# Patient Record
Sex: Male | Born: 1950 | Race: White | Hispanic: No | Smoking: Former smoker
Health system: Southern US, Community
[De-identification: ages and names within clinical notes are randomized; demographics above are authoritative.]

## PROBLEM LIST (undated history)

## (undated) DIAGNOSIS — C321 Malignant neoplasm of supraglottis: Secondary | ICD-10-CM

## (undated) DIAGNOSIS — F209 Schizophrenia, unspecified: Secondary | ICD-10-CM

## (undated) DIAGNOSIS — E785 Hyperlipidemia, unspecified: Secondary | ICD-10-CM

## (undated) DIAGNOSIS — E039 Hypothyroidism, unspecified: Secondary | ICD-10-CM

## (undated) DIAGNOSIS — K219 Gastro-esophageal reflux disease without esophagitis: Secondary | ICD-10-CM

## (undated) DIAGNOSIS — E119 Type 2 diabetes mellitus without complications: Secondary | ICD-10-CM

## (undated) DIAGNOSIS — F04 Amnestic disorder due to known physiological condition: Secondary | ICD-10-CM

## (undated) DIAGNOSIS — F1020 Alcohol dependence, uncomplicated: Secondary | ICD-10-CM

## (undated) HISTORY — PX: OTHER SURGICAL HISTORY: SHX169

---

## 2014-12-26 ENCOUNTER — Telehealth: Payer: Self-pay | Admitting: *Deleted

## 2014-12-26 NOTE — Telephone Encounter (Signed)
Office notes received from Dr. Edison Nasuti Sera at the [place] of CT scan

## 2014-12-29 ENCOUNTER — Telehealth: Payer: Self-pay | Admitting: *Deleted

## 2014-12-29 NOTE — Telephone Encounter (Signed)
  Oncology Nurse Navigator Documentation   Navigator Encounter Type: Introductory phone call (12/29/14 1556)     Attempted to speak with patient at [facility], no answer at nurses station. Will attempt again.  Gayleen Orem, RN, BSN, [place] at [place] [phone]                     Time Spent with Patient: 15 (12/29/14 1556)

## 2015-01-01 ENCOUNTER — Telehealth: Payer: Self-pay | Admitting: *Deleted

## 2015-01-01 NOTE — Telephone Encounter (Signed)
  Oncology Nurse Navigator Documentation   Navigator Encounter Type: Introductory phone call (patient and his sister, HCPOA) (01/01/15 1149)     Placed brief introductory call to new referral patient at [facility] and then at greater length to his sister who is HCPOA and will be bringing him to appt. 1. Introduced myself as the oncology nurse navigator that works with Dr. Isidore Moos to whom he has been referred by Dr. Edison Nasuti and with whom he has an appt this Friday at 0830/0900. 2. They confirmed understanding of referral and appt date/time. 3. I briefly explained my role as a navigator, indicated that I would be joining them during his appt. 4. I confirmed understanding of the [facility] location, explained arrival and RadOnc registration process for appt. 5. I provided my contact information, encouraged them to call with questions/concerns before appt. 6. She expressed appreciation for my call, understands I will call her again later this week before appt to answer any additional questions.  Gayleen Orem, RN, BSN, [place] at [place] [phone]                        Time Spent with Patient: 30 (01/01/15 1149)

## 2015-01-03 ENCOUNTER — Encounter: Payer: Self-pay | Admitting: *Deleted

## 2015-01-03 ENCOUNTER — Encounter: Payer: Self-pay | Admitting: Radiation Oncology

## 2015-01-03 NOTE — Progress Notes (Signed)
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 []  [x]  Pt denies  Swallowing concerns? [x]  []  Pt states due to pain, family reports he get choked while eating. SNF is giving him a soft diet because of choking  PEG? [x]  []  Placed in July, but not using at this time   Referrals Yes No Comments  Social Work? [x]  []    Dentistry? []  [x]    Swallowing therapy? []  [x]    Nutrition? []  [x]    Med/Onc? []  []  Unsure, sister reports he saw somebody in [place] at the [place], who did not recommend chemotherapy.   Safety Issues Yes No Comments  Prior radiation? []  [x]    Pacemaker/ICD? []  [x]    Possible current pregnancy? []  [x]    Is the patient on methotrexate? []  [x]     Tobacco/Marijuana/Snuff/ETOH use: tobacco: 1.5 pack per day for 47 years. ETOH: Heavy drinker, dementia, quit drinking around 4 years ago and lives in assisted living home in [place].  Past/Anticipated interventions by otolaryngology, if any: Direct Laryngoscopy with biopsy, Esophagoscopy 09/14/14  Past/Anticipated interventions by medical oncology, if any: None     Current Complaints / other details:  Saw Dr. Edison Nasuti ENT physician 08/23/14 in clinic. His voice has changed, hoarse, almost wet sounding at this time. His sister reports he may have received some xanax at [facility] because he is "some what out of it this morning". No chief complaint on file.
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
## 2015-01-04 ENCOUNTER — Telehealth: Payer: Self-pay | Admitting: *Deleted

## 2015-01-04 NOTE — Telephone Encounter (Addendum)
  Oncology Nurse Navigator Documentation   Navigator Encounter Type: Telephone (01/04/15 1600) Patient Visit Type: Follow-up (01/04/15 1600)     Spoke with patient sister/HCPOA to see if she had any questions prior her coming in tomorrow morning with her brother to see Dr. Isidore Moos.   I again reviewed arrival and registration procedures, she verbalized understanding. She indicated [facility] SNF will be bringing him to [facility]. I noted I will call [facility] to confirm understanding of arrival time and appt.  Called MG.  Transferred to Admission Office, [place] indicating [place] arrival for 0830 appt  Gayleen Orem, RN, BSN, [place] at [place] [phone]                    Time Spent with Patient: 15 (01/04/15 1600)

## 2015-01-05 ENCOUNTER — Ambulatory Visit
Admission: RE | Admit: 2015-01-05 | Discharge: 2015-01-05 | Disposition: A | Discharge status: Home / Self Care | Payer: Non-veteran care | Source: Ambulatory Visit | Attending: Radiation Oncology | Admitting: Radiation Oncology

## 2015-01-05 ENCOUNTER — Ambulatory Visit
Admission: RE | Admit: 2015-01-05 | Discharge: 2015-01-05 | Disposition: A | Discharge status: Home / Self Care | Payer: No Typology Code available for payment source | Source: Ambulatory Visit | Attending: Radiation Oncology | Admitting: Radiation Oncology

## 2015-01-05 ENCOUNTER — Telehealth: Payer: Self-pay | Admitting: *Deleted

## 2015-01-05 ENCOUNTER — Encounter: Payer: Self-pay | Admitting: *Deleted

## 2015-01-05 ENCOUNTER — Encounter: Payer: Self-pay | Admitting: Radiation Oncology

## 2015-01-05 VITALS — BP 106/61 | HR 89 | Temp 97.5°F | Wt 154.2 lb

## 2015-01-05 DIAGNOSIS — E119 Type 2 diabetes mellitus without complications: Secondary | ICD-10-CM | POA: Insufficient documentation

## 2015-01-05 DIAGNOSIS — F1010 Alcohol abuse, uncomplicated: Secondary | ICD-10-CM | POA: Insufficient documentation

## 2015-01-05 DIAGNOSIS — E039 Hypothyroidism, unspecified: Secondary | ICD-10-CM | POA: Diagnosis not present

## 2015-01-05 DIAGNOSIS — F209 Schizophrenia, unspecified: Secondary | ICD-10-CM | POA: Insufficient documentation

## 2015-01-05 DIAGNOSIS — Z7982 Long term (current) use of aspirin: Secondary | ICD-10-CM | POA: Insufficient documentation

## 2015-01-05 DIAGNOSIS — Z87891 Personal history of nicotine dependence: Secondary | ICD-10-CM | POA: Insufficient documentation

## 2015-01-05 DIAGNOSIS — Z79899 Other long term (current) drug therapy: Secondary | ICD-10-CM | POA: Diagnosis not present

## 2015-01-05 DIAGNOSIS — F0390 Unspecified dementia without behavioral disturbance: Secondary | ICD-10-CM | POA: Diagnosis not present

## 2015-01-05 DIAGNOSIS — Z510 Encounter for antineoplastic radiation therapy: Secondary | ICD-10-CM | POA: Diagnosis not present

## 2015-01-05 DIAGNOSIS — C321 Malignant neoplasm of supraglottis: Secondary | ICD-10-CM | POA: Diagnosis not present

## 2015-01-05 HISTORY — DX: Malignant neoplasm of supraglottis: C32.1

## 2015-01-05 HISTORY — DX: Type 2 diabetes mellitus without complications: E11.9

## 2015-01-05 HISTORY — DX: Schizophrenia, unspecified: F20.9

## 2015-01-05 HISTORY — DX: Gastro-esophageal reflux disease without esophagitis: K21.9

## 2015-01-05 HISTORY — DX: Hyperlipidemia, unspecified: E78.5

## 2015-01-05 HISTORY — DX: Amnestic disorder due to known physiological condition: F04

## 2015-01-05 HISTORY — DX: Alcohol dependence, uncomplicated: F10.20

## 2015-01-05 HISTORY — DX: Hypothyroidism, unspecified: E03.9

## 2015-01-05 LAB — CBC WITH DIFFERENTIAL/PLATELET
BASO%: 0.5 % (ref 0.0–2.0)
BASOS ABS: 0.1 10*3/uL (ref 0.0–0.1)
EOS ABS: 0.2 10*3/uL (ref 0.0–0.5)
EOS%: 1.7 % (ref 0.0–7.0)
HEMATOCRIT: 37.8 % — AB (ref 38.4–49.9)
HEMOGLOBIN: 12.6 g/dL — AB (ref 13.0–17.1)
LYMPH#: 2.3 10*3/uL (ref 0.9–3.3)
LYMPH%: 18.7 % (ref 14.0–49.0)
MCH: 30.2 pg (ref 27.2–33.4)
MCHC: 33.2 g/dL (ref 32.0–36.0)
MCV: 90.8 fL (ref 79.3–98.0)
MONO#: 1 10*3/uL — ABNORMAL HIGH (ref 0.1–0.9)
MONO%: 8.3 % (ref 0.0–14.0)
NEUT%: 70.8 % (ref 39.0–75.0)
NEUTROS ABS: 8.9 10*3/uL — AB (ref 1.5–6.5)
Platelets: 228 10*3/uL (ref 140–400)
RBC: 4.16 10*6/uL — ABNORMAL LOW (ref 4.20–5.82)
RDW: 14.6 % (ref 11.0–14.6)
WBC: 12.5 10*3/uL — AB (ref 4.0–10.3)

## 2015-01-05 LAB — TSH CHCC: TSH: 0.683 m(IU)/L (ref 0.320–4.118)

## 2015-01-05 LAB — COMPREHENSIVE METABOLIC PANEL (CC13)
ALBUMIN: 3.8 g/dL (ref 3.5–5.0)
ALK PHOS: 50 U/L (ref 40–150)
AST: 10 U/L (ref 5–34)
Anion Gap: 9 mEq/L (ref 3–11)
BILIRUBIN TOTAL: 0.96 mg/dL (ref 0.20–1.20)
BUN: 22.1 mg/dL (ref 7.0–26.0)
CO2: 26 mEq/L (ref 22–29)
CREATININE: 1.2 mg/dL (ref 0.7–1.3)
Calcium: 10 mg/dL (ref 8.4–10.4)
Chloride: 101 mEq/L (ref 98–109)
EGFR: 67 mL/min/{1.73_m2} — AB (ref >90)
GLUCOSE: 116 mg/dL (ref 70–140)
Potassium: 4.2 mEq/L (ref 3.5–5.1)
SODIUM: 136 meq/L (ref 136–145)
TOTAL PROTEIN: 7.2 g/dL (ref 6.4–8.3)

## 2015-01-05 MED ORDER — LARYNGOSCOPY SOLUTION RAD-ONC
15.0000 mL | Freq: Once | TOPICAL | Status: AC
  Administered 2015-01-05: 15 mL via TOPICAL
  Filled 2015-01-05: qty 15

## 2015-01-05 NOTE — Addendum Note (Signed)
Encounter addended by: Ernst Spell, RN on: 01/05/2015  4:42 PM<BR>     Documentation filed: Charges VN, Dx Association, Orders

## 2015-01-05 NOTE — Progress Notes (Signed)
  Oncology Nurse Navigator Documentation   Navigator Encounter Type: Initial RadOnc (01/05/15 0805)         Interventions: Education Method (01/05/15 0805)     Education Method: Verbal;Written (01/05/15 0805)      Met with patient during initial consult with Dr. Isidore Moos.  He was accompanied by his sister, Truman Hayward.    1. Patient currently residing at [facility]; [place]; cognitively challenged. 2. Further introduced myself as his Navigator, explained my role as a member of the Care Team.   3. Provided New Patient Information packet to sister (patient cognitively challenged), discussed contents:  Contact information for physician(s), myself, other members of the Care Team.  Advance Directive information ([place] blue pamphlet with LCSW contact info)  Fall Prevention Patient [place] information  Appointment Guideline  ACS Referral form  [place] campus map with highlight of [place] 4. Provided introductory explanation of radiation treatment including SIM planning and purpose of Aquaplast head and shoulder mask, showed them example.   5. Assisted Dr. Isidore Moos with laryngoscopy. 6. Provided a tour of SIM and [place] areas, explained treatment and arrival procedures. 7. I encouraged them to contact me with questions/concerns as treatments/procedures begin.  They verbalized understanding of information provided.      Time Spent with Patient: > 120 (01/05/15 0805)

## 2015-01-05 NOTE — Addendum Note (Signed)
Encounter addended by: Arbutus Ped, [place] on: 01/05/2015 11:18 AM<BR>     Documentation filed: Rx Order Verification

## 2015-01-05 NOTE — Addendum Note (Signed)
Encounter addended by: Ernst Spell, RN on: 01/05/2015 11:14 AM<BR>     Documentation filed: Inpatient MAR

## 2015-01-05 NOTE — Telephone Encounter (Signed)
CALLED PATIENT TO INFORM OF APPTS., NO ANSWER WILL CALL LATER.

## 2015-01-05 NOTE — Addendum Note (Signed)
Encounter addended by: Eppie Gibson, MD on: 01/05/2015 11:06 AM<BR>     Documentation filed: Follow-up Section

## 2015-01-05 NOTE — Addendum Note (Signed)
Encounter addended by: Ernst Spell, RN on: 01/05/2015  3:55 PM<BR>     Documentation filed: Charges VN

## 2015-01-05 NOTE — Addendum Note (Signed)
Encounter addended by: Ernst Spell, RN on: 01/05/2015 11:22 AM<BR>     Documentation filed: Dx Association, Inpatient MAR, Orders
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
## 2015-01-05 NOTE — Addendum Note (Signed)
Encounter addended by: Ernst Spell, RN on: 01/05/2015 11:09 AM<BR>     Documentation filed: Flowsheet VN, BPA Follow-up Actions

## 2015-01-08 ENCOUNTER — Telehealth: Payer: Self-pay | Admitting: *Deleted

## 2015-01-08 ENCOUNTER — Ambulatory Visit: Payer: Self-pay

## 2015-01-08 ENCOUNTER — Other Ambulatory Visit (HOSPITAL_COMMUNITY): Payer: Self-pay | Admitting: Radiation Oncology

## 2015-01-08 DIAGNOSIS — R1310 Dysphagia, unspecified: Secondary | ICD-10-CM

## 2015-01-08 NOTE — Telephone Encounter (Signed)
  Oncology Nurse Navigator Documentation   Navigator Encounter Type: Telephone (01/08/15 1003)         Interventions: Coordination of Care (01/08/15 1003)     Spoke with RN Curly [place] at [facility], provided update for upcoming appts:  Today's SLP cancelled.  10/18 1430 CT at [facility] Radiology with 1415 arrival, NPO 4 hrs prior  10/21 1300 MBS at [facility] Radiology with 1245 arrival, no restrictions  10/24 1400 PET at [facility] Radiology with 1345 arrival, NPO 6 hrs prior  10/24 1515 Nutrition at [facility] with 1500 arrival She verbalized understanding, indicated she wd relay info to his RN Ramona I provided my phone #, encouraged them to call me at any time with questions/concerns while he's being treated at [facility].  Gayleen Orem, RN, BSN, [place] at [place] [phone]            Time Spent with Patient: 15 (01/08/15 1003)

## 2015-01-09 ENCOUNTER — Encounter (HOSPITAL_COMMUNITY): Payer: Self-pay

## 2015-01-09 ENCOUNTER — Ambulatory Visit (HOSPITAL_COMMUNITY)
Admission: RE | Admit: 2015-01-09 | Discharge: 2015-01-09 | Disposition: A | Discharge status: Home / Self Care | Payer: Non-veteran care | Source: Ambulatory Visit | Attending: Radiation Oncology | Admitting: Radiation Oncology

## 2015-01-09 DIAGNOSIS — E119 Type 2 diabetes mellitus without complications: Secondary | ICD-10-CM | POA: Diagnosis not present

## 2015-01-09 DIAGNOSIS — C321 Malignant neoplasm of supraglottis: Secondary | ICD-10-CM | POA: Insufficient documentation

## 2015-01-09 DIAGNOSIS — M479 Spondylosis, unspecified: Secondary | ICD-10-CM | POA: Diagnosis not present

## 2015-01-09 DIAGNOSIS — F1021 Alcohol dependence, in remission: Secondary | ICD-10-CM | POA: Diagnosis not present

## 2015-01-09 DIAGNOSIS — F209 Schizophrenia, unspecified: Secondary | ICD-10-CM | POA: Diagnosis not present

## 2015-01-09 DIAGNOSIS — I6523 Occlusion and stenosis of bilateral carotid arteries: Secondary | ICD-10-CM | POA: Diagnosis not present

## 2015-01-09 DIAGNOSIS — R070 Pain in throat: Secondary | ICD-10-CM | POA: Insufficient documentation

## 2015-01-09 MED ORDER — IOHEXOL 300 MG/ML  SOLN
75.0000 mL | Freq: Once | INTRAMUSCULAR | Status: AC | PRN
  Administered 2015-01-09: 75 mL via INTRAVENOUS

## 2015-01-11 ENCOUNTER — Inpatient Hospital Stay
Admission: RE | Admit: 2015-01-11 | Discharge: 2015-01-11 | Disposition: A | Discharge status: Home / Self Care | Payer: Self-pay | Source: Ambulatory Visit | Attending: Radiation Oncology | Admitting: Radiation Oncology

## 2015-01-11 ENCOUNTER — Other Ambulatory Visit: Payer: Self-pay | Admitting: Radiation Oncology

## 2015-01-11 DIAGNOSIS — C321 Malignant neoplasm of supraglottis: Secondary | ICD-10-CM

## 2015-01-12 ENCOUNTER — Encounter: Payer: Self-pay | Admitting: *Deleted

## 2015-01-12 ENCOUNTER — Ambulatory Visit (HOSPITAL_COMMUNITY)
Admission: RE | Admit: 2015-01-12 | Discharge: 2015-01-12 | Disposition: A | Discharge status: Home / Self Care | Payer: Non-veteran care | Source: Ambulatory Visit | Attending: Radiation Oncology | Admitting: Radiation Oncology

## 2015-01-12 ENCOUNTER — Telehealth: Payer: Self-pay | Admitting: *Deleted

## 2015-01-12 DIAGNOSIS — R1310 Dysphagia, unspecified: Secondary | ICD-10-CM | POA: Insufficient documentation

## 2015-01-12 DIAGNOSIS — C321 Malignant neoplasm of supraglottis: Secondary | ICD-10-CM

## 2015-01-12 NOTE — Telephone Encounter (Signed)
  Oncology Nurse Navigator Documentation   Navigator Encounter Type: Telephone (01/12/15 1121)         Interventions: Coordination of Care (01/12/15 1121)     Spoke with Nance Pear, RN, at [facility], informed her of Monday appt at [facility] with arrival of 1245.  Provided phone # for [place] [phone], [phone]) to call with questions re patient's PEG.  Gayleen Orem, RN, BSN, [place] at [place] [phone]             Time Spent with Patient: 15 (01/12/15 1121)

## 2015-01-12 NOTE — Progress Notes (Addendum)
Oncology Nurse Navigator Documentation   Navigator Encounter Type: Other (01/12/15 1230)     Met patient and his sister, Nathaniel Floyd, upon arrival at [facility], escorted to [facility] Radiology for his MBSS. Discussed next Monday's appts with Nathaniel Floyd, provided her Epic appt calendar, encouraged 12:45 arrival to Radiation Oncology Waiting.   She stated she would be bringing him to appts leading up to and including initial RTs after which [facility] will bring him. She provided copy of ADs which I later delivered to [place] for scanning.  Gayleen Orem, RN, BSN, [place] at [place] [phone]                     Time Spent with Patient: 30 (01/12/15 1230)                        Time Spent with Patient: 30 (01/12/15 1230)

## 2015-01-13 ENCOUNTER — Encounter (HOSPITAL_COMMUNITY): Payer: Non-veteran care

## 2015-01-13 ENCOUNTER — Encounter (HOSPITAL_COMMUNITY): Payer: Self-pay

## 2015-01-13 ENCOUNTER — Emergency Department (HOSPITAL_COMMUNITY): Payer: Non-veteran care

## 2015-01-13 ENCOUNTER — Emergency Department (HOSPITAL_COMMUNITY)
Admission: EM | Admit: 2015-01-13 | Discharge: 2015-01-13 | Disposition: A | Discharge status: Home / Self Care | Payer: Non-veteran care | Attending: Emergency Medicine | Admitting: Emergency Medicine

## 2015-01-13 DIAGNOSIS — Z434 Encounter for attention to other artificial openings of digestive tract: Secondary | ICD-10-CM

## 2015-01-13 DIAGNOSIS — T85528A Displacement of other gastrointestinal prosthetic devices, implants and grafts, initial encounter: Secondary | ICD-10-CM

## 2015-01-13 DIAGNOSIS — Z79899 Other long term (current) drug therapy: Secondary | ICD-10-CM | POA: Insufficient documentation

## 2015-01-13 DIAGNOSIS — E039 Hypothyroidism, unspecified: Secondary | ICD-10-CM | POA: Diagnosis not present

## 2015-01-13 DIAGNOSIS — E119 Type 2 diabetes mellitus without complications: Secondary | ICD-10-CM | POA: Insufficient documentation

## 2015-01-13 DIAGNOSIS — Z87891 Personal history of nicotine dependence: Secondary | ICD-10-CM | POA: Diagnosis not present

## 2015-01-13 DIAGNOSIS — Z85828 Personal history of other malignant neoplasm of skin: Secondary | ICD-10-CM | POA: Diagnosis not present

## 2015-01-13 DIAGNOSIS — K9429 Other complications of gastrostomy: Secondary | ICD-10-CM | POA: Diagnosis present

## 2015-01-13 DIAGNOSIS — Z7951 Long term (current) use of inhaled steroids: Secondary | ICD-10-CM | POA: Insufficient documentation

## 2015-01-13 DIAGNOSIS — E785 Hyperlipidemia, unspecified: Secondary | ICD-10-CM | POA: Diagnosis not present

## 2015-01-13 DIAGNOSIS — F209 Schizophrenia, unspecified: Secondary | ICD-10-CM | POA: Insufficient documentation

## 2015-01-13 DIAGNOSIS — K219 Gastro-esophageal reflux disease without esophagitis: Secondary | ICD-10-CM | POA: Diagnosis not present

## 2015-01-13 DIAGNOSIS — Z7982 Long term (current) use of aspirin: Secondary | ICD-10-CM | POA: Insufficient documentation

## 2015-01-13 LAB — CBC WITH DIFFERENTIAL/PLATELET
BASOS ABS: 0 10*3/uL (ref 0.0–0.1)
BASOS PCT: 0 %
EOS ABS: 0.1 10*3/uL (ref 0.0–0.7)
EOS PCT: 1 %
HCT: 36 % — ABNORMAL LOW (ref 39.0–52.0)
Hemoglobin: 11.7 g/dL — ABNORMAL LOW (ref 13.0–17.0)
Lymphocytes Relative: 14 %
Lymphs Abs: 1.7 10*3/uL (ref 0.7–4.0)
MCH: 29.9 pg (ref 26.0–34.0)
MCHC: 32.5 g/dL (ref 30.0–36.0)
MCV: 92.1 fL (ref 78.0–100.0)
MONO ABS: 0.8 10*3/uL (ref 0.1–1.0)
Monocytes Relative: 7 %
Neutro Abs: 9.4 10*3/uL — ABNORMAL HIGH (ref 1.7–7.7)
Neutrophils Relative %: 78 %
PLATELETS: 225 10*3/uL (ref 150–400)
RBC: 3.91 MIL/uL — AB (ref 4.22–5.81)
RDW: 13.8 % (ref 11.5–15.5)
WBC: 12 10*3/uL — AB (ref 4.0–10.5)

## 2015-01-13 LAB — BASIC METABOLIC PANEL
ANION GAP: 9 (ref 5–15)
BUN: 15 mg/dL (ref 6–20)
CALCIUM: 9.6 mg/dL (ref 8.9–10.3)
CO2: 27 mmol/L (ref 22–32)
Chloride: 96 mmol/L — ABNORMAL LOW (ref 101–111)
Creatinine, Ser: 0.82 mg/dL (ref 0.61–1.24)
GFR calc non Af Amer: >60 mL/min (ref >60)
GLUCOSE: 114 mg/dL — AB (ref 65–99)
Potassium: 4.8 mmol/L (ref 3.5–5.1)
SODIUM: 132 mmol/L — AB (ref 135–145)

## 2015-01-13 LAB — PROTIME-INR
INR: 1.08 (ref 0.00–1.49)
Prothrombin Time: 14.2 seconds (ref 11.6–15.2)

## 2015-01-13 MED ORDER — SODIUM CHLORIDE 0.9 % IV BOLUS (SEPSIS): 1000.0000 mL | Freq: Once | INTRAVENOUS | Status: DC

## 2015-01-13 NOTE — Discharge Instructions (Signed)
PLEASE DO NOT USE THE TUBE. PLEASE HAVE Nathaniel Floyd COME IN TO [place] (NOT THE ER), ASK THE RECEPTIONIST FOR OUTPATIENT RADIOLOGY DEPARTMENT - AND WAIT FOR THE DOCTOR TO HELP YOU THERE. MAKE SURE HE ARRIVES BY 8:30 AM. KEEP PATIENT NPO AFTER MIDNIGHT.  DR. Shick is the interventional radiologist who will take care of the procedure.

## 2015-01-13 NOTE — ED Notes (Signed)
Pt stable, ambulatory, states understanding of discharge instructions reported instructions to [facility]
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
## 2015-01-13 NOTE — ED Notes (Signed)
PTAR called 6:44

## 2015-01-13 NOTE — ED Notes (Signed)
Dr. Nanavati at bedside 

## 2015-01-13 NOTE — ED Notes (Addendum)
Per EMS - from [facility]. Pt gtube dislocated - unsure if dislocated today or yesterday; placed October 18, 2014. NPO at this time. Supposed to have procedure at [place] at bedside. Pt has squamous cell carcinoma of epiglottis.

## 2015-01-13 NOTE — ED Notes (Signed)
[place] spoke with Leilani Able who stated the G-tube was placed on July 25th at [facility] and it was unknown how long it has been out that she had never worked with it before.

## 2015-01-14 ENCOUNTER — Other Ambulatory Visit (HOSPITAL_COMMUNITY): Payer: Self-pay | Admitting: Emergency Medicine

## 2015-01-14 ENCOUNTER — Ambulatory Visit (HOSPITAL_COMMUNITY)
Admission: RE | Admit: 2015-01-14 | Discharge: 2015-01-14 | Disposition: A | Discharge status: Home / Self Care | Payer: Non-veteran care | Source: Ambulatory Visit | Attending: Emergency Medicine | Admitting: Emergency Medicine

## 2015-01-14 DIAGNOSIS — Z431 Encounter for attention to gastrostomy: Secondary | ICD-10-CM | POA: Diagnosis not present

## 2015-01-14 DIAGNOSIS — T85528A Displacement of other gastrointestinal prosthetic devices, implants and grafts, initial encounter: Secondary | ICD-10-CM

## 2015-01-14 MED ORDER — IOHEXOL 300 MG/ML  SOLN
50.0000 mL | Freq: Once | INTRAMUSCULAR | Status: DC | PRN
  Administered 2015-01-14: 10 mL
  Filled 2015-01-14: qty 50

## 2015-01-14 MED ORDER — LIDOCAINE VISCOUS 2 % MT SOLN
OROMUCOSAL | Status: AC
  Filled 2015-01-14: qty 15

## 2015-01-14 NOTE — Procedures (Signed)
Successful replacement of a 20 fr Gtube  No comp Ready for use

## 2015-01-15 ENCOUNTER — Ambulatory Visit
Admission: RE | Admit: 2015-01-15 | Discharge: 2015-01-15 | Disposition: A | Discharge status: Home / Self Care | Payer: No Typology Code available for payment source | Source: Ambulatory Visit | Attending: Radiation Oncology | Admitting: Radiation Oncology

## 2015-01-15 ENCOUNTER — Encounter: Payer: Self-pay | Admitting: *Deleted

## 2015-01-15 ENCOUNTER — Ambulatory Visit: Payer: Self-pay | Admitting: Nutrition

## 2015-01-15 ENCOUNTER — Encounter (HOSPITAL_COMMUNITY)
Admission: RE | Admit: 2015-01-15 | Discharge: 2015-01-15 | Disposition: A | Discharge status: Home / Self Care | Payer: Non-veteran care | Source: Ambulatory Visit | Attending: Radiation Oncology | Admitting: Radiation Oncology

## 2015-01-15 DIAGNOSIS — C321 Malignant neoplasm of supraglottis: Secondary | ICD-10-CM | POA: Diagnosis present

## 2015-01-15 DIAGNOSIS — Z510 Encounter for antineoplastic radiation therapy: Secondary | ICD-10-CM | POA: Diagnosis not present

## 2015-01-15 LAB — GLUCOSE, CAPILLARY: GLUCOSE-CAPILLARY: 95 mg/dL (ref 65–99)

## 2015-01-15 MED ORDER — FLUDEOXYGLUCOSE F - 18 (FDG) INJECTION
9.0000 | Freq: Once | INTRAVENOUS | Status: DC | PRN
  Administered 2015-01-15: 9 via INTRAVENOUS
  Filled 2015-01-15: qty 9

## 2015-01-15 NOTE — Progress Notes (Signed)
  Oncology Nurse Navigator Documentation   Navigator Encounter Type: Other (01/15/15 1245) Patient Visit Type: Radonc (01/15/15 1245) Treatment Phase: CT SIM;Other (01/15/15 1245)     To provide support and encouragement, care continuity and to assess for needs, met with patient for [facility] after which escorted him to [facility] for PET. His sister Truman Hayward accompanied him. He tolerated CT SIM without difficulty.  I reviewed date of RT start, expected education by RN during first week of RT. Sister brought [place] AD forms for completion.  I encouraged her to contact Lauren when they arrive for Nutrition appt following PET to complete. They understand I can be contacted with needs/concerns.  Gayleen Orem, RN, BSN, [place] at [place] [phone]                   Time Spent with Patient: 90 (01/15/15 1245)

## 2015-01-15 NOTE — Progress Notes (Signed)
Head and Neck Cancer Simulation, IMRT treatment planning note   Outpatient  Diagnosis:    ICD-9-CM ICD-10-CM   1. Cancer, epiglottis (HCC) 161.1 C32.1     The patient was taken to the CT simulator and laid in the supine position on the table. An Aquaplast head and shoulder mask was custom fitted to the patient's anatomy. High-resolution CT axial imaging was obtained of the head and neck with contrast. I verified that the quality of the imaging is good for treatment planning. 1 Medically Necessary Treatment Device was fabricated and supervised by me: Aquaplast mask.   Treatment planning note I plan to treat the patient with IMRT. I plan to treat the patient's right soft palate and epiglottic tumors and bilateral neck nodes. I plan to treat to a total dose of 70 Gray in 35  fractions. Dose calculation was ordered from dosimetry.  IMRT planning Note  IMRT is an important modality to deliver adequate dose to the patient's at risk tissues while sparing the patient's normal structures, including the: esophagus, parotid tissue, mandible, brain stem, spinal cord, oral cavity, brachial plexus.  This justifies the use of IMRT in the patient's treatment.     -----------------------------------  Eppie Gibson, MD

## 2015-01-15 NOTE — Progress Notes (Addendum)
64 year old male diagnosed with cancer of the epiglottis.  He is to receive radiation therapy.  Past medical history includes GERD, hypothyroidism, hyperlipidemia, schizophrenia, Korsakoff syndrome, alcoholism and diabetes.  Medications include vitamin D, Celexa, Colace, Haldol, Synthroid, Glucophage, Seroquel, Zantac, Zocor.  Labs were reviewed.  Height: 5 feet 9 inches. Weight: 154.2 pounds on October 14.  Estimated nutrition needs: 2100-2450 calories, 100-120 grams protein, 2.3 L fluid.  Patient noted to have dysphasia and is status post feeding tube placement. Patient is status post CT scan. Patient lives at a skilled nursing facility. Met with patient's sister, Truman Hayward, who reports patient has now started tube feedings and is tolerating 6 cans Nutren 1.5 daily providing 2250 cal, 102 g protein, and 1146 mL free water. She reports patient is no longer eating by mouth. She is not aware of any nutrition issues with tube feedings.  Nutrition diagnosis: Food and nutrition related knowledge deficit related to epiglottis cancer as evidenced by no prior need for nutrition related information.  Intervention:  Educated patient's sister that tube feeding appears adequate to meet patient's estimated nutrition needs. Recommended a minimum of 5-8 ounce glasses of water daily, via feeding tube as flushes before and after feedings. Explained that our goal would be weight maintenance throughout treatment. Provided my contact information for patient's sister to provide to nursing home if needed. [facility]. Questions were answered.  Teach back method used.  Monitoring, evaluation, goals: Patient will tolerate tube feedings to meet greater than 90% of estimated needs for weight maintenance.  Next visit: Patient's sister has my contact information for questions.  **Disclaimer: This note was dictated with voice recognition software. Similar sounding words can  inadvertently be transcribed and this note may contain transcription errors which may not have been corrected upon publication of note.**

## 2015-01-17 ENCOUNTER — Encounter: Payer: Self-pay | Admitting: *Deleted

## 2015-01-17 ENCOUNTER — Telehealth: Payer: Self-pay | Admitting: *Deleted

## 2015-01-17 NOTE — Telephone Encounter (Signed)
  Oncology Nurse Navigator Documentation   Navigator Encounter Type: Other (01/17/15 2094)     Per Dr. Pearlie Oyster guidance, called pt sister informed results of 10/24 showed absence of metastatic disease, RT to proceed as originally planned. I confirmed her understanding of 11/2 020 [place].    She indicated she will meet her brother at [facility], requested that [facility] staff start bringing him for RT appts.  I indicated I will provide MG appt schedule.   Gayleen Orem, RN, BSN, [place] at [place] [phone]                      Time Spent with Patient: 15 (01/17/15 0906)

## 2015-01-17 NOTE — Progress Notes (Signed)
  Oncology Nurse Navigator Documentation   Navigator Encounter Type: Written/Letter (01/17/15 0941)         Interventions: Coordination of Care (01/17/15 0941)     Sent fax to patient's [facility] RN, Fuller Song [phone]) with November calendar of appts for RT starting next week.  Transmission notice successful. Included note indicating per patient's sister, MG should begin providing transport for appts; I noted arrival 20 minutes prior to appt time. I requested RN call upon receipt/review.  Gayleen Orem, RN, BSN, [place] at [place] [phone]            Time Spent with Patient: 15 (01/17/15 0941)

## 2015-01-19 DIAGNOSIS — Z510 Encounter for antineoplastic radiation therapy: Secondary | ICD-10-CM | POA: Diagnosis not present

## 2015-01-23 ENCOUNTER — Ambulatory Visit: Payer: No Typology Code available for payment source

## 2015-01-23 NOTE — Progress Notes (Signed)
IMRT Device Note Outpatient    ICD-9-CM ICD-10-CM   1. Cancer, epiglottis (HCC) 161.1 C32.1     9.6 delivered field widths represent one set of IMRT treatment devices. The code is 8203300789.  -----------------------------------  Eppie Gibson, MD

## 2015-01-24 ENCOUNTER — Telehealth: Payer: Self-pay

## 2015-01-24 ENCOUNTER — Ambulatory Visit
Admission: RE | Admit: 2015-01-24 | Discharge: 2015-01-24 | Disposition: A | Discharge status: Home / Self Care | Payer: No Typology Code available for payment source | Source: Ambulatory Visit | Attending: Radiation Oncology | Admitting: Radiation Oncology

## 2015-01-24 DIAGNOSIS — C321 Malignant neoplasm of supraglottis: Secondary | ICD-10-CM

## 2015-01-24 DIAGNOSIS — Z510 Encounter for antineoplastic radiation therapy: Secondary | ICD-10-CM | POA: Diagnosis not present

## 2015-01-24 NOTE — Telephone Encounter (Signed)
Nathaniel Floyd became agitated during his radiation treatment today, and the technician Lennie Muckle) asked I call [place] [facility] to request he be given a calming medicine before his radiation treatments each day. I spoke with Ramona his nurse, and she stated she had given him some Haldol before he left today. She stated she may try some pain medicine tomorrow, and see if that helps. I will inform Lennie Muckle of this conversation.

## 2015-01-25 ENCOUNTER — Ambulatory Visit
Admission: RE | Admit: 2015-01-25 | Discharge: 2015-01-25 | Disposition: A | Discharge status: Home / Self Care | Payer: No Typology Code available for payment source | Source: Ambulatory Visit | Attending: Radiation Oncology | Admitting: Radiation Oncology

## 2015-01-25 ENCOUNTER — Telehealth: Payer: Self-pay

## 2015-01-25 ENCOUNTER — Encounter: Payer: Self-pay | Admitting: *Deleted

## 2015-01-25 DIAGNOSIS — Z510 Encounter for antineoplastic radiation therapy: Secondary | ICD-10-CM | POA: Diagnosis not present

## 2015-01-25 NOTE — Progress Notes (Signed)
  Oncology Nurse Navigator Documentation   Navigator Encounter Type: Treatment (01/25/15 0935) Patient Visit Type: Radonc (01/25/15 0935)     To provide support and encouragement, care continuity and to assess for needs, met with patient after [place] tmt.  He was accompanied by his [employer] transporter. He completed tmt but according to therapists, he had difficulty lying still, had to be scanned x2 before he could be treated.  [name], lead therapist, indicated RN Anderson Malta is going to call [facility] to check on pre-treatment medication. I spoke with patient, he did not seem to remember me from previous encounters. I will increase presence at future tmts.  Gayleen Orem, RN, BSN, [place] at [place] [phone]                     Time Spent with Patient: 15 (01/25/15 0935)

## 2015-01-25 NOTE — Telephone Encounter (Signed)
I called [place] home and spoke with Nathaniel Floyd (Nathaniel Floyd's nurse). She stated she had given him 10mg  of Haldol and 20 ml of Norco this morning before he left for Radiation treatment. I mentioned to her that he was constantly moving his legs and arms while he was being treated today. She stated she would speak with Dr. Deforest Hoyles at [facility] about prescribing ativan possibly to assist with his future radiation treatments. I will inform [name] of this phone call.

## 2015-01-26 ENCOUNTER — Ambulatory Visit
Admission: RE | Admit: 2015-01-26 | Discharge: 2015-01-26 | Disposition: A | Discharge status: Home / Self Care | Payer: No Typology Code available for payment source | Source: Ambulatory Visit | Attending: Radiation Oncology | Admitting: Radiation Oncology

## 2015-01-26 DIAGNOSIS — Z510 Encounter for antineoplastic radiation therapy: Secondary | ICD-10-CM | POA: Diagnosis not present

## 2015-01-29 ENCOUNTER — Ambulatory Visit
Admission: RE | Admit: 2015-01-29 | Discharge: 2015-01-29 | Disposition: A | Discharge status: Home / Self Care | Payer: No Typology Code available for payment source | Source: Ambulatory Visit | Attending: Radiation Oncology | Admitting: Radiation Oncology

## 2015-01-29 ENCOUNTER — Encounter: Payer: Self-pay | Admitting: Radiation Oncology

## 2015-01-29 ENCOUNTER — Ambulatory Visit
Admission: RE | Admit: 2015-01-29 | Discharge: 2015-01-29 | Disposition: A | Discharge status: Home / Self Care | Payer: Non-veteran care | Source: Ambulatory Visit | Attending: Radiation Oncology | Admitting: Radiation Oncology

## 2015-01-29 ENCOUNTER — Other Ambulatory Visit (HOSPITAL_COMMUNITY): Payer: Self-pay | Admitting: Radiation Oncology

## 2015-01-29 ENCOUNTER — Encounter: Payer: Self-pay | Admitting: *Deleted

## 2015-01-29 VITALS — BP 103/61 | HR 74 | Temp 97.4°F | Ht 69.0 in | Wt 151.7 lb

## 2015-01-29 DIAGNOSIS — R1310 Dysphagia, unspecified: Secondary | ICD-10-CM

## 2015-01-29 DIAGNOSIS — C321 Malignant neoplasm of supraglottis: Secondary | ICD-10-CM | POA: Insufficient documentation

## 2015-01-29 DIAGNOSIS — Z510 Encounter for antineoplastic radiation therapy: Secondary | ICD-10-CM | POA: Diagnosis not present

## 2015-01-29 MED ORDER — SONAFINE EX EMUL
1.0000 "application " | Freq: Once | CUTANEOUS | Status: AC
  Administered 2015-01-29: 1 via TOPICAL
  Filled 2015-01-29: qty 45

## 2015-01-29 NOTE — Progress Notes (Signed)
Mr. Aundrey Elahi is here for his 4th fraction of radiaton to his Right soft palate and epiglottis. He denies any pain at this time. He is being fed by [facility] through his feeding tube. The orders are Nutren 1.5 can bolus three times a day 9am- 12pm with a water flush 80 ml before and after bolus. Per the notes he also receives a continuous feeding 1pm-1am 99ml X 12, and a water flush of 1200 ml three times a day. His skin is normal appearing, and I will provide them with biafine cream today. He was able to stay still during radiation today.  BP 103/61 mmHg  Pulse 74  Temp(Src) 97.4 F (36.3 C)  Ht 5\' 9"  (1.753 m)  Wt 151 lb 11.2 oz (68.811 kg)  BMI 22.39 kg/m2   Wt Readings from Last 3 Encounters:  01/29/15 151 lb 11.2 oz (68.811 kg)  01/05/15 154 lb 3.2 oz (69.945 kg)

## 2015-01-29 NOTE — Progress Notes (Signed)
   Weekly Management Note:  Outpatient    ICD-9-CM ICD-10-CM   1. 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  CBCT/MVCT images/Port film x-rays were reviewed.  The chart was checked. No  New complaints.  Patient is a very limited historian, with sister today.  Still in [place]. Denies significant pain.  Physical Findings:  Wt Readings from Last 3 Encounters:  01/29/15 151 lb 11.2 oz (68.811 kg)  01/05/15 154 lb 3.2 oz (69.945 kg)    height is 5\' 9"  (1.753 m) and weight is 151 lb 11.2 oz (68.811 kg). His temperature is 97.4 F (36.3 C). His blood pressure is 103/61 and his pulse is 74.  Right soft palate lesion slightly larger, skin without irritation. Moist mouth  CBC    Component Value Date/Time   WBC 12.0* 01/13/2015 1550   WBC 12.5* 01/05/2015 1031   RBC 3.91* 01/13/2015 1550   RBC 4.16* 01/05/2015 1031   HGB 11.7* 01/13/2015 1550   HGB 12.6* 01/05/2015 1031   HCT 36.0* 01/13/2015 1550   HCT 37.8* 01/05/2015 1031   PLT 225 01/13/2015 1550   PLT 228 01/05/2015 1031   MCV 92.1 01/13/2015 1550   MCV 90.8 01/05/2015 1031   MCH 29.9 01/13/2015 1550   MCH 30.2 01/05/2015 1031   MCHC 32.5 01/13/2015 1550   MCHC 33.2 01/05/2015 1031   RDW 13.8 01/13/2015 1550   RDW 14.6 01/05/2015 1031   LYMPHSABS 1.7 01/13/2015 1550   LYMPHSABS 2.3 01/05/2015 1031   MONOABS 0.8 01/13/2015 1550   MONOABS 1.0* 01/05/2015 1031   EOSABS 0.1 01/13/2015 1550   EOSABS 0.2 01/05/2015 1031   BASOSABS 0.0 01/13/2015 1550   BASOSABS 0.1 01/05/2015 1031     CMP     Component Value Date/Time   NA 132* 01/13/2015 1550   NA 136 01/05/2015 1031   K 4.8 01/13/2015 1550   K 4.2 01/05/2015 1031   CL 96* 01/13/2015 1550   CO2 27 01/13/2015 1550   CO2 26 01/05/2015 1031   GLUCOSE 114* 01/13/2015 1550   GLUCOSE 116 01/05/2015 1031   BUN 15 01/13/2015 1550   BUN 22.1 01/05/2015 1031   CREATININE 0.82 01/13/2015 1550   CREATININE 1.2 01/05/2015 1031   CALCIUM 9.6 01/13/2015 1550   CALCIUM 10.0 01/05/2015 1031   PROT 7.2 01/05/2015 1031   ALBUMIN 3.8 01/05/2015 1031   AST 10 01/05/2015 1031   ALT <9 01/05/2015 1031   ALKPHOS 50 01/05/2015 1031   BILITOT 0.96 01/05/2015 1031   GFRNONAA >60 01/13/2015 1550   GFRAA >60 01/13/2015 1550     Impression:  The patient is tolerating radiotherapy.   Plan:  Continue radiotherapy as planned. Wrote note to SNF about offering Hycet prn pain in future, as on his med list.  Recommended followup with SNF's SLP due to abnormal MBSS.  Pt cannot see Garald Balding due to insurance issues.  -----------------------------------  Eppie Gibson, MD

## 2015-01-29 NOTE — Progress Notes (Signed)
Pt here for patient teaching.  Pt given Radiation and You booklet, Managing Acute Radiation Side Effects for Head and Neck Cancer handout, skin care instructions and Biafine. Pt reports they have refused to watch the Radiation Therapy Education video.  Reviewed areas of pertinence such as fatigue, mouth changes, skin changes, throat changes, breast swelling, cough, shortness of breath, earaches and taste changes . Pt able to give teach back of to pat skin, use unscented/gentle soap and use baby wipes,apply Biafine bid, avoid applying anything to skin within 4 hours of treatment and to use an electric razor if they must shave. Pt needs reinforcement of information given and will contact nursing with any questions or concerns.  I will contact [facility] nurse, Ramona with instruction regarding skin care.

## 2015-01-30 ENCOUNTER — Ambulatory Visit
Admission: RE | Admit: 2015-01-30 | Discharge: 2015-01-30 | Disposition: A | Discharge status: Home / Self Care | Payer: No Typology Code available for payment source | Source: Ambulatory Visit | Attending: Radiation Oncology | Admitting: Radiation Oncology

## 2015-01-30 DIAGNOSIS — Z510 Encounter for antineoplastic radiation therapy: Secondary | ICD-10-CM | POA: Diagnosis not present

## 2015-01-31 ENCOUNTER — Telehealth: Payer: Self-pay

## 2015-01-31 ENCOUNTER — Ambulatory Visit: Admission: RE | Admit: 2015-01-31 | Payer: No Typology Code available for payment source | Source: Ambulatory Visit

## 2015-01-31 NOTE — Progress Notes (Signed)
  Oncology Nurse Navigator Documentation   Navigator Encounter Type: Treatment (01/29/15 1000) Patient Visit Type: Radonc (01/29/15 1000)     Met with patient during Tomo tmt.  He was accompanied by his sister, Nathaniel Floyd. She noted she will not accompany him every day though will bring him for tmt on Monday's so she can sit in on WUT with Nathaniel Floyd. He tolerated tmt without difficulty. I escorted them to WUT appt with Nathaniel Floyd. Will continue to follow.  Gayleen Orem, RN, BSN, [place] at [place] [phone]                  Time Spent with Patient: 30 (01/29/15 1000)

## 2015-01-31 NOTE — Telephone Encounter (Signed)
I called and spoke with Ramona at [facility] about Mr. Cord Wilczynski. I informed her that he was not able to receive treatment today because of his agitation. She told me she had given him Haldol and Hycet approximately one hour before he was transported to the [facility]. She even stated he was asleep when his transportation came to get him this morning. I encouraged her to continue to give him the medicine thirty minutes to one hour before transport picked him up to assist in completing his radiation treatments without agitation. She will again speak with the MD at [facility] about any other alternatives to assist with completing his radiation. I also discussed the education booklet and cream I had given him to bring back to [facility] on Monday 11/7. She had received it and verbalized her understanding regarding use of the cream, and the information presented in the booklet.

## 2015-02-01 ENCOUNTER — Ambulatory Visit: Payer: No Typology Code available for payment source

## 2015-02-01 ENCOUNTER — Encounter: Payer: Self-pay | Admitting: *Deleted

## 2015-02-01 DIAGNOSIS — Z510 Encounter for antineoplastic radiation therapy: Secondary | ICD-10-CM | POA: Diagnosis not present

## 2015-02-01 NOTE — Progress Notes (Signed)
LATE NOTE:  [place] Psychosocial Distress Screening Clinical Social Work  Clinical Social Work was referred by distress screening protocol.  The patient scored a 10 on the Psychosocial Distress Thermometer which indicates severe distress. Clinical Social Worker met with patient and patient's sister, Nathaniel Floyd, prior to starting treatment to assess for distress and other psychosocial needs. [name] shared no concerns, reported he was hesitant to do treatment.  Patient's sister spoke on patient's behalf.  Patient plans to stay at [facility] through treatment process, with long term goal to be to move close to sister after treatment.  CSW encouraged patient and family to contact CSW as needed for support, resources, or questions/concerns.  ONCBCN DISTRESS SCREENING 01/05/2015  Screening Type Initial Screening  Distress experienced in past week (1-10) 10  Practical problem type Housing  Emotional problem type Depression;Feeling hopeless  Physical Problem type Pain;Talking  Physician notified of physical symptoms Yes  Referral to clinical psychology No  Referral to clinical social work Yes  Referral to dietition No  Referral to financial advocate No  Referral to support programs No  Referral to palliative care No  Other (No Data)    Clinical Social Worker follow up needed: No.  If yes, follow up plan:  Polo Riley, MSW, LCSW, OSW-C Clinical Social Worker [place] [phone]

## 2015-02-02 ENCOUNTER — Ambulatory Visit: Admission: RE | Admit: 2015-02-02 | Payer: No Typology Code available for payment source | Source: Ambulatory Visit

## 2015-02-02 ENCOUNTER — Encounter: Payer: Self-pay | Admitting: *Deleted

## 2015-02-02 ENCOUNTER — Encounter: Payer: Non-veteran care | Admitting: Nutrition

## 2015-02-02 DIAGNOSIS — Z510 Encounter for antineoplastic radiation therapy: Secondary | ICD-10-CM | POA: Diagnosis not present

## 2015-02-02 NOTE — Progress Notes (Signed)
  Oncology Nurse Navigator Documentation   Navigator Encounter Type: Other (02/02/15 1731)         Interventions: Coordination of Care (02/02/15 1731)     To facilitate timely arrival and treatment, faxed to patient's [place], Fuller Song [phone]), appt schedule for remainder of November and December.  Per Tomo Lead Jehna's request, I asked that patient arrive for treatment in RadOnc 20 minutes before appt to avoid tmt delay.  Successful TX Transmit received.  Gayleen Orem, RN, BSN, [place] at [place] [phone]          Time Spent with Patient: 30 (02/02/15 1731)

## 2015-02-05 ENCOUNTER — Ambulatory Visit
Admission: RE | Admit: 2015-02-05 | Discharge: 2015-02-05 | Disposition: A | Discharge status: Home / Self Care | Payer: Non-veteran care | Source: Ambulatory Visit | Attending: Radiation Oncology | Admitting: Radiation Oncology

## 2015-02-05 ENCOUNTER — Encounter: Payer: Self-pay | Admitting: Radiation Oncology

## 2015-02-05 ENCOUNTER — Ambulatory Visit: Payer: No Typology Code available for payment source

## 2015-02-05 VITALS — BP 108/72 | HR 72 | Temp 98.3°F | Ht 69.0 in | Wt 152.1 lb

## 2015-02-05 DIAGNOSIS — Z510 Encounter for antineoplastic radiation therapy: Secondary | ICD-10-CM | POA: Diagnosis not present

## 2015-02-05 DIAGNOSIS — C321 Malignant neoplasm of supraglottis: Secondary | ICD-10-CM

## 2015-02-05 NOTE — Progress Notes (Signed)
Nathaniel Floyd is here for his 8th fraction of radiation to his R soft palate/glottis. He reports some fatigue. He says he has "regular" pain in his throat. He has a cough today, and reports coughing up sputum, but is unable to tell me any details regarding color. He has white patches noted to the very back of his throat that is visible to me. He is receiving nutrition and water through his PEG tube of Nutren can 3 times a day 9am-12pm with a water flush of 38ml before and after. He is also receiving continuous feeding from 1pm-1am at 2ml/hr with a 1268ml water flush 3 times. He is not able to communicate very well today, but denies any other problems at this time.   BP 108/72 mmHg  Pulse 72  Temp(Src) 98.3 F (36.8 C)  Ht 5\' 9"  (1.753 m)  Wt 152 lb 1.6 oz (68.992 kg)  BMI 22.45 kg/m2  SpO2 98%   Wt Readings from Last 3 Encounters:  02/05/15 152 lb 1.6 oz (68.992 kg)  01/29/15 151 lb 11.2 oz (68.811 kg)  01/05/15 154 lb 3.2 oz (69.945 kg)

## 2015-02-05 NOTE — Progress Notes (Signed)
Weekly Management Note:  Outpatient    ICD-9-CM ICD-10-CM   1. 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  CBCT/MVCT images/Port film x-rays were reviewed.  The chart was checked. No  New complaints.   Still staying at [facility].  Mr. Conroy is here for his 8th fraction of radiation to his R soft palate/glottis. He reports some fatigue. He says he has "regular" pain in his throat. He has a cough today, and reports coughing up sputum, but is unable to tell me any details regarding color. He is receiving nutrition and water through his PEG tube of Nutren can 3 times a day 9am-12pm with a water flush of 28ml before and after. He is also receiving continuous feeding from 1pm-1am at 57ml/hr with a 1225ml water flush 3 times. He is not able to communicate very well today, but denies any other problems at this time.     Physical Findings:  Wt Readings from Last 3 Encounters:  02/05/15 152 lb 1.6 oz (68.992 kg)  01/29/15 151 lb 11.2 oz (68.811 kg)  01/05/15 154 lb 3.2 oz (69.945 kg)    height is 5\' 9"  (1.753 m) and weight is 152 lb 1.6 oz (68.992 kg). His temperature is 98.3 F (36.8 C). His blood pressure is 108/72 and his pulse is 72. His oxygen saturation is 98%.  Right soft palate lesion still present, skin without irritation. Moist mouth, no thrush  CBC    Component Value Date/Time   WBC 12.0* 01/13/2015 1550   WBC 12.5* 01/05/2015 1031   RBC 3.91* 01/13/2015 1550   RBC 4.16* 01/05/2015 1031   HGB 11.7* 01/13/2015 1550   HGB 12.6* 01/05/2015 1031   HCT 36.0* 01/13/2015 1550   HCT 37.8* 01/05/2015 1031   PLT 225 01/13/2015 1550   PLT 228 01/05/2015 1031   MCV 92.1 01/13/2015 1550   MCV 90.8 01/05/2015 1031   MCH 29.9 01/13/2015 1550   MCH 30.2 01/05/2015 1031   MCHC 32.5 01/13/2015 1550   MCHC 33.2 01/05/2015 1031   RDW 13.8 01/13/2015 1550   RDW 14.6 01/05/2015 1031   LYMPHSABS 1.7 01/13/2015 1550   LYMPHSABS 2.3 01/05/2015 1031   MONOABS 0.8 01/13/2015 1550   MONOABS 1.0* 01/05/2015 1031   EOSABS 0.1 01/13/2015 1550   EOSABS 0.2 01/05/2015 1031   BASOSABS 0.0 01/13/2015 1550   BASOSABS 0.1 01/05/2015 1031     CMP     Component Value Date/Time   NA 132* 01/13/2015 1550   NA 136 01/05/2015 1031   K 4.8 01/13/2015 1550   K 4.2 01/05/2015 1031   CL 96* 01/13/2015 1550   CO2 27 01/13/2015 1550   CO2 26 01/05/2015 1031   GLUCOSE 114* 01/13/2015 1550   GLUCOSE 116 01/05/2015 1031   BUN 15 01/13/2015 1550   BUN 22.1 01/05/2015 1031   CREATININE 0.82 01/13/2015 1550   CREATININE 1.2 01/05/2015 1031   CALCIUM 9.6 01/13/2015 1550   CALCIUM 10.0 01/05/2015 1031   PROT 7.2 01/05/2015 1031   ALBUMIN 3.8 01/05/2015 1031   AST 10 01/05/2015 1031   ALT <9 01/05/2015 1031   ALKPHOS 50 01/05/2015 1031   BILITOT 0.96 01/05/2015 1031   GFRNONAA >60 01/13/2015 1550   GFRAA >60 01/13/2015 1550     Impression:  The patient is tolerating radiotherapy.   Plan:  Continue radiotherapy as planned. Wrote note to SNF about offering Hycet prn pain in future, again, as on his med list. May develop mucositis soon.    -----------------------------------  Eppie Gibson, MD

## 2015-02-06 ENCOUNTER — Ambulatory Visit: Payer: Non-veteran care | Admitting: Nutrition

## 2015-02-06 ENCOUNTER — Ambulatory Visit: Payer: No Typology Code available for payment source

## 2015-02-06 DIAGNOSIS — Z510 Encounter for antineoplastic radiation therapy: Secondary | ICD-10-CM | POA: Diagnosis not present

## 2015-02-06 NOTE — Progress Notes (Signed)
Patient presented to nutrition follow up after radiation therapy. Patient was unable to communicate his side effects. He could not tell me what tube feeding formula or regimen he was on at [facility]. Transportation person could not offer any information on patient.  Patient is being followed by RD at [facility]. Weight is documented as 152.1 Pounds November 14 which is stable. Will continue to follow patient as needed.

## 2015-02-07 ENCOUNTER — Ambulatory Visit: Payer: No Typology Code available for payment source

## 2015-02-07 ENCOUNTER — Telehealth: Payer: Self-pay

## 2015-02-07 NOTE — Telephone Encounter (Signed)
I called and spoke to Ramona RN at [facility] again today. I made her aware that Nathaniel Floyd was not able to receive treatment again today due to movement. She reports he was given the same medicine at the same time today, to assist in his receiving radiation. I asked her if anything had changed from yesterday when he was able to complete his treatment, and she denied that anything was different. She is very helpful in assisting [place] to complete his radiation, and will try to match as closely as possible with the timing and medicine he received Tuesday which worked well.

## 2015-02-08 ENCOUNTER — Ambulatory Visit: Payer: No Typology Code available for payment source

## 2015-02-08 DIAGNOSIS — Z510 Encounter for antineoplastic radiation therapy: Secondary | ICD-10-CM | POA: Diagnosis not present

## 2015-02-09 ENCOUNTER — Ambulatory Visit: Payer: No Typology Code available for payment source

## 2015-02-09 DIAGNOSIS — Z510 Encounter for antineoplastic radiation therapy: Secondary | ICD-10-CM | POA: Diagnosis not present

## 2015-02-12 ENCOUNTER — Ambulatory Visit
Admission: RE | Admit: 2015-02-12 | Discharge: 2015-02-12 | Disposition: A | Discharge status: Home / Self Care | Payer: Non-veteran care | Source: Ambulatory Visit | Attending: Radiation Oncology | Admitting: Radiation Oncology

## 2015-02-12 ENCOUNTER — Ambulatory Visit
Admission: RE | Admit: 2015-02-12 | Discharge: 2015-02-12 | Disposition: A | Discharge status: Home / Self Care | Payer: No Typology Code available for payment source | Source: Ambulatory Visit | Attending: Radiation Oncology | Admitting: Radiation Oncology

## 2015-02-12 ENCOUNTER — Encounter: Payer: Self-pay | Admitting: Radiation Oncology

## 2015-02-12 ENCOUNTER — Ambulatory Visit (HOSPITAL_COMMUNITY): Payer: Non-veteran care

## 2015-02-12 ENCOUNTER — Other Ambulatory Visit (HOSPITAL_COMMUNITY): Payer: Non-veteran care

## 2015-02-12 VITALS — BP 117/75 | HR 76 | Resp 16 | Wt 153.3 lb

## 2015-02-12 DIAGNOSIS — C321 Malignant neoplasm of supraglottis: Secondary | ICD-10-CM

## 2015-02-12 DIAGNOSIS — Z510 Encounter for antineoplastic radiation therapy: Secondary | ICD-10-CM | POA: Diagnosis not present

## 2015-02-12 NOTE — Progress Notes (Signed)
   Weekly Management Note:  Outpatient    ICD-9-CM ICD-10-CM   1. 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  CBCT/MVCT images/Port film x-rays were reviewed.  The chart was checked. Reports  "rough" throat.   Still staying at [facility].  Reports a productive cough but, is unable to describe color of sputum. Reports receiving nutritional via PEG tube.  With driver and brother in law today.   Physical Findings:  Wt Readings from Last 3 Encounters:  02/12/15 153 lb 4.8 oz (69.536 kg)  02/05/15 152 lb 1.6 oz (68.992 kg)  01/29/15 151 lb 11.2 oz (68.811 kg)    weight is 153 lb 4.8 oz (69.536 kg). His blood pressure is 117/75 and his pulse is 76. His respiration is 16 and oxygen saturation is 100%.  Right soft palate lesion still present with mucositis surrounding it, skin without irritation. Moist mouth, no thrush  CBC    Component Value Date/Time   WBC 12.0* 01/13/2015 1550   WBC 12.5* 01/05/2015 1031   RBC 3.91* 01/13/2015 1550   RBC 4.16* 01/05/2015 1031   HGB 11.7* 01/13/2015 1550   HGB 12.6* 01/05/2015 1031   HCT 36.0* 01/13/2015 1550   HCT 37.8* 01/05/2015 1031   PLT 225 01/13/2015 1550   PLT 228 01/05/2015 1031   MCV 92.1 01/13/2015 1550   MCV 90.8 01/05/2015 1031   MCH 29.9 01/13/2015 1550   MCH 30.2 01/05/2015 1031   MCHC 32.5 01/13/2015 1550   MCHC 33.2 01/05/2015 1031   RDW 13.8 01/13/2015 1550   RDW 14.6 01/05/2015 1031   LYMPHSABS 1.7 01/13/2015 1550   LYMPHSABS 2.3 01/05/2015 1031   MONOABS 0.8 01/13/2015 1550   MONOABS 1.0* 01/05/2015 1031   EOSABS 0.1 01/13/2015 1550   EOSABS 0.2 01/05/2015 1031   BASOSABS 0.0 01/13/2015 1550   BASOSABS 0.1 01/05/2015 1031     CMP     Component Value Date/Time   NA 132* 01/13/2015 1550   NA 136 01/05/2015 1031   K 4.8 01/13/2015 1550   K 4.2 01/05/2015 1031     CL 96* 01/13/2015 1550   CO2 27 01/13/2015 1550   CO2 26 01/05/2015 1031   GLUCOSE 114* 01/13/2015 1550   GLUCOSE 116 01/05/2015 1031   BUN 15 01/13/2015 1550   BUN 22.1 01/05/2015 1031   CREATININE 0.82 01/13/2015 1550   CREATININE 1.2 01/05/2015 1031   CALCIUM 9.6 01/13/2015 1550   CALCIUM 10.0 01/05/2015 1031   PROT 7.2 01/05/2015 1031   ALBUMIN 3.8 01/05/2015 1031   AST 10 01/05/2015 1031   ALT <9 01/05/2015 1031   ALKPHOS 50 01/05/2015 1031   BILITOT 0.96 01/05/2015 1031   GFRNONAA >60 01/13/2015 1550   GFRAA >60 01/13/2015 1550     Impression:  The patient is tolerating radiotherapy.   Plan:  Continue radiotherapy as planned. Wrote note to SNF about offering pain meds PRN.  Consider duragesic patch in future. -----------------------------------  Eppie Gibson, MD

## 2015-02-12 NOTE — Progress Notes (Signed)
Weight and vitals stable. Flat affect noted. Riding in wheelchair today. Reports fatigue. Reports "rough" throat pain that he is unable to score. Reports a productive cough but, is unable to describe color of sputum. Reports receiving nutritional via PEG tube.   BP 117/75 mmHg  Pulse 76  Resp 16  Wt 153 lb 4.8 oz (69.536 kg)  SpO2 100% Wt Readings from Last 3 Encounters:  02/12/15 153 lb 4.8 oz (69.536 kg)  02/05/15 152 lb 1.6 oz (68.992 kg)  01/29/15 151 lb 11.2 oz (68.811 kg)

## 2015-02-13 ENCOUNTER — Ambulatory Visit
Admission: RE | Admit: 2015-02-13 | Discharge: 2015-02-13 | Disposition: A | Discharge status: Home / Self Care | Payer: No Typology Code available for payment source | Source: Ambulatory Visit | Attending: Radiation Oncology | Admitting: Radiation Oncology

## 2015-02-13 DIAGNOSIS — Z510 Encounter for antineoplastic radiation therapy: Secondary | ICD-10-CM | POA: Diagnosis not present

## 2015-02-14 ENCOUNTER — Ambulatory Visit: Admission: RE | Admit: 2015-02-14 | Payer: No Typology Code available for payment source | Source: Ambulatory Visit

## 2015-02-14 ENCOUNTER — Encounter: Payer: Non-veteran care | Admitting: Nutrition

## 2015-02-16 ENCOUNTER — Ambulatory Visit: Payer: No Typology Code available for payment source

## 2015-02-19 ENCOUNTER — Ambulatory Visit: Payer: Non-veteran care

## 2015-02-19 ENCOUNTER — Ambulatory Visit
Admission: RE | Admit: 2015-02-19 | Discharge: 2015-02-19 | Disposition: A | Discharge status: Home / Self Care | Payer: No Typology Code available for payment source | Source: Ambulatory Visit | Attending: Radiation Oncology | Admitting: Radiation Oncology

## 2015-02-19 ENCOUNTER — Ambulatory Visit: Admission: RE | Admit: 2015-02-19 | Payer: No Typology Code available for payment source | Source: Ambulatory Visit

## 2015-02-19 ENCOUNTER — Ambulatory Visit
Admission: RE | Admit: 2015-02-19 | Discharge: 2015-02-19 | Disposition: A | Discharge status: Home / Self Care | Payer: Non-veteran care | Source: Ambulatory Visit | Attending: Radiation Oncology | Admitting: Radiation Oncology

## 2015-02-20 ENCOUNTER — Ambulatory Visit
Admission: RE | Admit: 2015-02-20 | Discharge: 2015-02-20 | Disposition: A | Discharge status: Home / Self Care | Payer: No Typology Code available for payment source | Source: Ambulatory Visit | Attending: Radiation Oncology | Admitting: Radiation Oncology

## 2015-02-20 DIAGNOSIS — Z510 Encounter for antineoplastic radiation therapy: Secondary | ICD-10-CM | POA: Diagnosis not present

## 2015-02-21 ENCOUNTER — Ambulatory Visit
Admission: RE | Admit: 2015-02-21 | Discharge: 2015-02-21 | Disposition: A | Discharge status: Home / Self Care | Payer: No Typology Code available for payment source | Source: Ambulatory Visit | Attending: Radiation Oncology | Admitting: Radiation Oncology

## 2015-02-21 DIAGNOSIS — Z510 Encounter for antineoplastic radiation therapy: Secondary | ICD-10-CM | POA: Diagnosis not present

## 2015-02-22 ENCOUNTER — Ambulatory Visit: Payer: Non-veteran care | Admitting: Nutrition

## 2015-02-22 ENCOUNTER — Ambulatory Visit
Admission: RE | Admit: 2015-02-22 | Discharge: 2015-02-22 | Disposition: A | Discharge status: Home / Self Care | Payer: No Typology Code available for payment source | Source: Ambulatory Visit | Attending: Radiation Oncology | Admitting: Radiation Oncology

## 2015-02-22 DIAGNOSIS — Z510 Encounter for antineoplastic radiation therapy: Secondary | ICD-10-CM | POA: Diagnosis not present

## 2015-02-22 NOTE — Progress Notes (Signed)
Follow-up completed with patient after radiation therapy. Patient was alone except for his driver. Patient is residing at [facility]. Weight documented as 153.3 pounds November 21 increased from 151 7 pounds November 7. I asked patient if he was having nausea/if he felt sick.  Patient responded by saying no and shook his head no. Patient unable to tell me when he had his last bowel movement. Patient being nourished by tube feedings with an order of 6 cans Nutren 1.5 providing 2250 cal, 102 g protein, 1146 mL free water. Tube feedings appear adequate to meet patient's needs. Continue to monitor as needed.  Next follow-up scheduled on Thursday, December 15.

## 2015-02-23 ENCOUNTER — Ambulatory Visit
Admission: RE | Admit: 2015-02-23 | Discharge: 2015-02-23 | Disposition: A | Discharge status: Home / Self Care | Payer: No Typology Code available for payment source | Source: Ambulatory Visit | Attending: Radiation Oncology | Admitting: Radiation Oncology

## 2015-02-23 DIAGNOSIS — Z510 Encounter for antineoplastic radiation therapy: Secondary | ICD-10-CM | POA: Diagnosis not present

## 2015-02-26 ENCOUNTER — Encounter: Payer: Self-pay | Admitting: Radiation Oncology

## 2015-02-26 ENCOUNTER — Encounter: Payer: Self-pay | Admitting: *Deleted

## 2015-02-26 ENCOUNTER — Telehealth: Payer: Self-pay

## 2015-02-26 ENCOUNTER — Ambulatory Visit
Admission: RE | Admit: 2015-02-26 | Discharge: 2015-02-26 | Disposition: A | Discharge status: Home / Self Care | Payer: Non-veteran care | Source: Ambulatory Visit | Attending: Radiation Oncology | Admitting: Radiation Oncology

## 2015-02-26 ENCOUNTER — Ambulatory Visit
Admission: RE | Admit: 2015-02-26 | Discharge: 2015-02-26 | Disposition: A | Discharge status: Home / Self Care | Payer: No Typology Code available for payment source | Source: Ambulatory Visit | Attending: Radiation Oncology | Admitting: Radiation Oncology

## 2015-02-26 VITALS — BP 101/81 | HR 78 | Temp 98.4°F | Ht 69.0 in | Wt 153.3 lb

## 2015-02-26 DIAGNOSIS — C321 Malignant neoplasm of supraglottis: Secondary | ICD-10-CM

## 2015-02-26 DIAGNOSIS — Z510 Encounter for antineoplastic radiation therapy: Secondary | ICD-10-CM | POA: Diagnosis not present

## 2015-02-26 MED ORDER — FLUCONAZOLE 10 MG/ML PO SUSR: ORAL | Status: AC

## 2015-02-26 NOTE — Telephone Encounter (Signed)
I called [facility] and spoke to Patmos RN who is caring for Mr. Dion today. I discussed Mr. Rauschenbach new order for Fluconazole for his thrush. She was aware of the new order and the need to hold simvastatin for 3 weeks while taking the fluconazole and to not exceed 20 mg of citalopram daily while on fluconazole. She stated she had seen the written instructions, and verbalized to me her understanding. I asked her to call with any further questions she may have.

## 2015-02-26 NOTE — Progress Notes (Signed)
  Oncology Nurse Navigator Documentation   Navigator Encounter Type: Clinic/MDC;Treatment (02/26/15 1015) Patient Visit Type: Radonc (02/26/15 1015)     To provide support and encouragement, care continuity and to assess for needs, met with patient during [place] with Dr. Isidore Moos.  He was accompanied by his [employer] transporter. He has completed 18/35 XRT sessions. Subjectively, he was slow to respond to inquiries, provided appropriate responses. He reported:  Having BMs "every couple of days"  Drinking water and milk in additional to PEG supplementation. He verbalized understanding he can have his nurse Ramona call me with needs/concerns.  Gayleen Orem, RN, BSN, [place] at [place] [phone]                    Time Spent with Patient: 30 (02/26/15 1015)

## 2015-02-26 NOTE — Progress Notes (Signed)
Nathaniel Floyd presents for his 18th fraction of radiation to his Epiglottis. He states he feels well and denies pain. He is receiving tubefeeding's at the nursing facility (I am unsure exactly what kind, as there is not a list today). He is unclear as to whether he is swallowing liquids orally because of his mental state. His feeding tube is slightly red with no dressing present. The skin over his neck is red, and hyperpigmented, but he again denies tenderness or pain at this site. His mouth is moist, but there are white patches observed.   BP 101/81 mmHg  Pulse 78  Temp(Src) 98.4 F (36.9 C)  Ht 5\' 9"  (1.753 m)  Wt 153 lb 4.8 oz (69.536 kg)  BMI 22.63 kg/m2   Wt Readings from Last 3 Encounters:  02/26/15 153 lb 4.8 oz (69.536 kg)  02/12/15 153 lb 4.8 oz (69.536 kg)  02/05/15 152 lb 1.6 oz (68.992 kg)

## 2015-02-26 NOTE — Progress Notes (Addendum)
   Weekly Management Note:  Outpatient    ICD-9-CM ICD-10-CM   1. 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  CBCT/MVCT images/Port film x-rays were reviewed.  The chart was checked. Patient reports little.  No complaints, no paperwork provided by attendant today.  Vitals stable.  PEG feeding continues. He is not getting tx'd 5 times a week due to occasional intolerance of mask or inability to lay still. Nursing communicating needs to patient's facility for premedication.   Physical Findings:  Wt Readings from Last 3 Encounters:  02/26/15 153 lb 4.8 oz (69.536 kg)  02/12/15 153 lb 4.8 oz (69.536 kg)  02/05/15 152 lb 1.6 oz (68.992 kg)    height is 5\' 9"  (1.753 m) and weight is 153 lb 4.8 oz (69.536 kg). His temperature is 98.4 F (36.9 C). His blood pressure is 101/81 and his pulse is 78.  ++ oral thrush; do not see soft palate lesion; Neck- Skin intact.  CBC    Component Value Date/Time   WBC 12.0* 01/13/2015 1550   WBC 12.5* 01/05/2015 1031   RBC 3.91* 01/13/2015 1550   RBC 4.16* 01/05/2015 1031   HGB 11.7* 01/13/2015 1550   HGB 12.6* 01/05/2015 1031   HCT 36.0* 01/13/2015 1550   HCT 37.8* 01/05/2015 1031   PLT 225 01/13/2015 1550   PLT 228 01/05/2015 1031   MCV 92.1 01/13/2015 1550   MCV 90.8 01/05/2015 1031   MCH 29.9 01/13/2015 1550   MCH 30.2 01/05/2015 1031   MCHC 32.5 01/13/2015 1550   MCHC 33.2 01/05/2015 1031   RDW 13.8 01/13/2015 1550   RDW 14.6 01/05/2015 1031   LYMPHSABS 1.7 01/13/2015 1550   LYMPHSABS 2.3 01/05/2015 1031   MONOABS 0.8 01/13/2015 1550   MONOABS 1.0* 01/05/2015 1031   EOSABS 0.1 01/13/2015 1550   EOSABS 0.2 01/05/2015 1031   BASOSABS 0.0 01/13/2015 1550   BASOSABS 0.1 01/05/2015 1031     CMP     Component Value Date/Time   NA 132*  01/13/2015 1550   NA 136 01/05/2015 1031   K 4.8 01/13/2015 1550   K 4.2 01/05/2015 1031   CL 96* 01/13/2015 1550   CO2 27 01/13/2015 1550   CO2 26 01/05/2015 1031   GLUCOSE 114* 01/13/2015 1550   GLUCOSE 116 01/05/2015 1031   BUN 15 01/13/2015 1550   BUN 22.1 01/05/2015 1031   CREATININE 0.82 01/13/2015 1550   CREATININE 1.2 01/05/2015 1031   CALCIUM 9.6 01/13/2015 1550   CALCIUM 10.0 01/05/2015 1031   PROT 7.2 01/05/2015 1031   ALBUMIN 3.8 01/05/2015 1031   AST 10 01/05/2015 1031   ALT <9 01/05/2015 1031   ALKPHOS 50 01/05/2015 1031   BILITOT 0.96 01/05/2015 1031   GFRNONAA >60 01/13/2015 1550   GFRAA >60 01/13/2015 1550     Impression:  The patient is tolerating radiotherapy.   Plan:  Continue radiotherapy as planned. Thrush on exam.  Soft palate lesion no longer visible. Wrote Rx for fluconazole, and instructions on how to modulate other meds that interact w/ this.  -----------------------------------  Eppie Gibson, MD

## 2015-02-27 ENCOUNTER — Ambulatory Visit
Admission: RE | Admit: 2015-02-27 | Discharge: 2015-02-27 | Disposition: A | Discharge status: Home / Self Care | Payer: No Typology Code available for payment source | Source: Ambulatory Visit | Attending: Radiation Oncology | Admitting: Radiation Oncology

## 2015-02-27 DIAGNOSIS — Z510 Encounter for antineoplastic radiation therapy: Secondary | ICD-10-CM | POA: Diagnosis not present

## 2015-02-28 ENCOUNTER — Ambulatory Visit
Admission: RE | Admit: 2015-02-28 | Discharge: 2015-02-28 | Disposition: A | Discharge status: Home / Self Care | Payer: No Typology Code available for payment source | Source: Ambulatory Visit | Attending: Radiation Oncology | Admitting: Radiation Oncology

## 2015-02-28 DIAGNOSIS — Z510 Encounter for antineoplastic radiation therapy: Secondary | ICD-10-CM | POA: Diagnosis not present

## 2015-03-01 ENCOUNTER — Encounter: Payer: Non-veteran care | Admitting: Nutrition

## 2015-03-01 ENCOUNTER — Ambulatory Visit
Admission: RE | Admit: 2015-03-01 | Discharge: 2015-03-01 | Disposition: A | Discharge status: Home / Self Care | Payer: No Typology Code available for payment source | Source: Ambulatory Visit | Attending: Radiation Oncology | Admitting: Radiation Oncology

## 2015-03-01 DIAGNOSIS — Z510 Encounter for antineoplastic radiation therapy: Secondary | ICD-10-CM | POA: Diagnosis not present

## 2015-03-02 ENCOUNTER — Ambulatory Visit
Admission: RE | Admit: 2015-03-02 | Discharge: 2015-03-02 | Disposition: A | Discharge status: Home / Self Care | Payer: No Typology Code available for payment source | Source: Ambulatory Visit | Attending: Radiation Oncology | Admitting: Radiation Oncology

## 2015-03-02 DIAGNOSIS — Z510 Encounter for antineoplastic radiation therapy: Secondary | ICD-10-CM | POA: Diagnosis not present

## 2015-03-05 ENCOUNTER — Ambulatory Visit
Admission: RE | Admit: 2015-03-05 | Discharge: 2015-03-05 | Disposition: A | Discharge status: Home / Self Care | Payer: No Typology Code available for payment source | Source: Ambulatory Visit | Attending: Radiation Oncology | Admitting: Radiation Oncology

## 2015-03-05 ENCOUNTER — Encounter: Payer: Self-pay | Admitting: Radiation Oncology

## 2015-03-05 ENCOUNTER — Ambulatory Visit
Admission: RE | Admit: 2015-03-05 | Discharge: 2015-03-05 | Disposition: A | Discharge status: Home / Self Care | Payer: Non-veteran care | Source: Ambulatory Visit | Attending: Radiation Oncology | Admitting: Radiation Oncology

## 2015-03-05 VITALS — BP 98/68 | HR 72 | Temp 97.9°F | Ht 69.0 in | Wt 152.9 lb

## 2015-03-05 DIAGNOSIS — C321 Malignant neoplasm of supraglottis: Secondary | ICD-10-CM

## 2015-03-05 DIAGNOSIS — Z510 Encounter for antineoplastic radiation therapy: Secondary | ICD-10-CM | POA: Diagnosis not present

## 2015-03-05 MED ORDER — LIDOCAINE VISCOUS 2 % MT SOLN: OROMUCOSAL | Status: AC

## 2015-03-05 NOTE — Progress Notes (Signed)
Weekly Management Note:  Outpatient    ICD-9-CM ICD-10-CM   1. 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  CBCT/MVCT images/Port film x-rays were reviewed.  The chart was checked.    He denies pain at first, but does admit to throat pain at times, and he points to his mid- throat area when asked. Marland Kitchen He states he is not dizzy when he stands, but is slightly orthostatic. Notes from [facility] home state he is receiving continuous feedings from 1pm to 1 am at 90 ml/hr. He also receives a water flush of 1200 ml three times daily.    Sister present with SNF staff member today. Sister says [facility] didn't approve SLP therapy.   Physical Findings:  Wt Readings from Last 3 Encounters:  03/05/15 152 lb 14.4 oz (69.355 kg)  02/26/15 153 lb 4.8 oz (69.536 kg)  02/12/15 153 lb 4.8 oz (69.536 kg)    height is 5\' 9"  (1.753 m) and weight is 152 lb 14.4 oz (69.355 kg). His temperature is 97.9 F (36.6 C). His blood pressure is 98/68 and his pulse is 72.  no remaining oral thrush; do not see soft palate lesion;  There is palate bleeding today, Neck- Skin  Dry, erythematous  CBC    Component Value Date/Time   WBC 12.0* 01/13/2015 1550   WBC 12.5* 01/05/2015 1031   RBC 3.91* 01/13/2015 1550   RBC 4.16* 01/05/2015 1031   HGB 11.7* 01/13/2015 1550   HGB 12.6* 01/05/2015 1031   HCT 36.0* 01/13/2015 1550   HCT 37.8* 01/05/2015 1031   PLT 225 01/13/2015 1550   PLT 228 01/05/2015 1031   MCV 92.1 01/13/2015 1550   MCV 90.8 01/05/2015 1031   MCH 29.9 01/13/2015 1550   MCH 30.2 01/05/2015 1031   MCHC 32.5 01/13/2015 1550   MCHC 33.2 01/05/2015 1031   RDW 13.8 01/13/2015 1550   RDW 14.6 01/05/2015 1031   LYMPHSABS 1.7 01/13/2015 1550   LYMPHSABS 2.3 01/05/2015 1031   MONOABS 0.8 01/13/2015 1550   MONOABS  1.0* 01/05/2015 1031   EOSABS 0.1 01/13/2015 1550   EOSABS 0.2 01/05/2015 1031   BASOSABS 0.0 01/13/2015 1550   BASOSABS 0.1 01/05/2015 1031     CMP     Component Value Date/Time   NA 132* 01/13/2015 1550   NA 136 01/05/2015 1031   K 4.8 01/13/2015 1550   K 4.2 01/05/2015 1031   CL 96* 01/13/2015 1550   CO2 27 01/13/2015 1550   CO2 26 01/05/2015 1031   GLUCOSE 114* 01/13/2015 1550   GLUCOSE 116 01/05/2015 1031   BUN 15 01/13/2015 1550   BUN 22.1 01/05/2015 1031   CREATININE 0.82 01/13/2015 1550   CREATININE 1.2 01/05/2015 1031   CALCIUM 9.6 01/13/2015 1550   CALCIUM 10.0 01/05/2015 1031   PROT 7.2 01/05/2015 1031   ALBUMIN 3.8 01/05/2015 1031   AST 10 01/05/2015 1031   ALT <9 01/05/2015 1031   ALKPHOS 50 01/05/2015 1031   BILITOT 0.96 01/05/2015 1031   GFRNONAA >60 01/13/2015 1550   GFRAA >60 01/13/2015 1550     Impression:  The patient is tolerating radiotherapy. Not receiving SLP at SNF despite my recommendations.  Plan:  Continue radiotherapy as planned.   Lidocaine PRN soreness.  Recommended oral swabs to moisten mouth.  Note  sent to [facility] billing staff today: Ailene Ravel, Patient needs speech and swallowing therapy/evaluation (SLP) at his nursing home, but sister believes [facility] denies this.  Is there anything you can do to find out if this is accurate?  I wrote instructions to the Skilled nursing facility to provide SLP but I am not sure they will do it without VA approval. If you can facilitate VA approval, that would be great.  Any info you get, please relay to sister. SS -----------------------------------  Eppie Gibson, MD

## 2015-03-05 NOTE — Progress Notes (Signed)
Nathaniel Floyd presents for his his 23rd fraction of radiation to his Right Soft Palate and Epiglottis. He denies pain at first, but does admit to throat pain at times, and he points to his mid- throat area when asked. The skin to his throat is red, dry, but intact. His mouth has dried and new blood visible in the back. His mouth and lips are also very dry, and it was difficult to open his mouth at first due to his lips sticking together. He states he is not dizzy when he stands, but is slightly orthostatic. Notes from [facility] home state he is receiving continuous feedings from 1pm to 1 am at 90 ml/hr. He also receives a water flush of 1200 ml three times daily. His PEG has a dressing in place with some yellow drainage visible.   BP 98/68 mmHg  Pulse 72  Temp(Src) 97.9 F (36.6 C)  Ht 5\' 9"  (1.753 m)  Wt 152 lb 14.4 oz (69.355 kg)  BMI 22.57 kg/m2  BP sitting 98/68 pulse 72. BP standing 91/60 pulse 99.  Wt Readings from Last 3 Encounters:  03/05/15 152 lb 14.4 oz (69.355 kg)  02/26/15 153 lb 4.8 oz (69.536 kg)  02/12/15 153 lb 4.8 oz (69.536 kg)

## 2015-03-06 ENCOUNTER — Ambulatory Visit
Admission: RE | Admit: 2015-03-06 | Discharge: 2015-03-06 | Disposition: A | Discharge status: Home / Self Care | Payer: No Typology Code available for payment source | Source: Ambulatory Visit | Attending: Radiation Oncology | Admitting: Radiation Oncology

## 2015-03-06 DIAGNOSIS — Z510 Encounter for antineoplastic radiation therapy: Secondary | ICD-10-CM | POA: Diagnosis not present

## 2015-03-07 ENCOUNTER — Ambulatory Visit
Admission: RE | Admit: 2015-03-07 | Discharge: 2015-03-07 | Disposition: A | Discharge status: Home / Self Care | Payer: No Typology Code available for payment source | Source: Ambulatory Visit | Attending: Radiation Oncology | Admitting: Radiation Oncology

## 2015-03-07 DIAGNOSIS — Z510 Encounter for antineoplastic radiation therapy: Secondary | ICD-10-CM | POA: Diagnosis not present

## 2015-03-08 ENCOUNTER — Telehealth: Payer: Self-pay

## 2015-03-08 ENCOUNTER — Ambulatory Visit
Admission: RE | Admit: 2015-03-08 | Discharge: 2015-03-08 | Disposition: A | Discharge status: Home / Self Care | Payer: No Typology Code available for payment source | Source: Ambulatory Visit | Attending: Radiation Oncology | Admitting: Radiation Oncology

## 2015-03-08 ENCOUNTER — Encounter: Payer: Non-veteran care | Admitting: Nutrition

## 2015-03-08 DIAGNOSIS — Z510 Encounter for antineoplastic radiation therapy: Secondary | ICD-10-CM | POA: Diagnosis not present

## 2015-03-08 NOTE — Telephone Encounter (Signed)
I called [facility] and spoke with Ashby Dawes (his nurse) about him arriving late or just on time for his radiation appointment and causing delays. She voiced her understanding of my concerns, and reported she will speak to transportation about the issue. She did tell me that it was her understanding that he was being picked up at 9:00 to make it on time for his 9:55 radiation appointment.

## 2015-03-09 ENCOUNTER — Ambulatory Visit
Admission: RE | Admit: 2015-03-09 | Discharge: 2015-03-09 | Disposition: A | Discharge status: Home / Self Care | Payer: No Typology Code available for payment source | Source: Ambulatory Visit | Attending: Radiation Oncology | Admitting: Radiation Oncology

## 2015-03-09 DIAGNOSIS — Z510 Encounter for antineoplastic radiation therapy: Secondary | ICD-10-CM | POA: Diagnosis not present

## 2015-03-12 ENCOUNTER — Ambulatory Visit
Admission: RE | Admit: 2015-03-12 | Discharge: 2015-03-12 | Disposition: A | Discharge status: Home / Self Care | Payer: Non-veteran care | Source: Ambulatory Visit | Attending: Radiation Oncology | Admitting: Radiation Oncology

## 2015-03-12 ENCOUNTER — Encounter: Payer: Self-pay | Admitting: Radiation Oncology

## 2015-03-12 ENCOUNTER — Ambulatory Visit
Admission: RE | Admit: 2015-03-12 | Discharge: 2015-03-12 | Disposition: A | Discharge status: Home / Self Care | Payer: No Typology Code available for payment source | Source: Ambulatory Visit | Attending: Radiation Oncology | Admitting: Radiation Oncology

## 2015-03-12 VITALS — BP 99/65 | HR 67 | Temp 98.2°F | Ht 69.0 in | Wt 154.7 lb

## 2015-03-12 DIAGNOSIS — Z510 Encounter for antineoplastic radiation therapy: Secondary | ICD-10-CM | POA: Diagnosis not present

## 2015-03-12 DIAGNOSIS — C321 Malignant neoplasm of supraglottis: Secondary | ICD-10-CM

## 2015-03-12 NOTE — Progress Notes (Signed)
Mr. Damuth is here for his 28th fraction of radiation to his Right soft palate, epiglottis and bilateral neck. He is only receiving nutrition and water through his tube. The exact type and amount is not available to me, as a list did not come from [facility] today. His sister is here who states it has not changed since last week, because she has not received a phone call from [facility] about any changes. His PEG site has a dressing intact without any visible drainage noted. The skin to his neck is red, and dry, and he reports the facility is applying the cream as directed to his skin. His mouth has thick saliva noted and white patches to the back of his mouth. He reports they are using swabs to clean is mouth, and his mouth is not as dry as last week. He is currently very sleepy from the medicine used to help calm him to receive radiation, but is appropriate when asked questions with a minimal response.    BP 99/65 mmHg  Pulse 67  Temp(Src) 98.2 F (36.8 C)  Ht 5\' 9"  (1.753 m)  Wt 154 lb 11.2 oz (70.171 kg)  BMI 22.83 kg/m2  Orthostatics: BP sitting 99/65, pulse 67 and BP standing 108/61 and pulse 65  Wt Readings from Last 3 Encounters:  03/12/15 154 lb 11.2 oz (70.171 kg)  03/05/15 152 lb 14.4 oz (69.355 kg)  02/26/15 153 lb 4.8 oz (69.536 kg)   I

## 2015-03-12 NOTE — Progress Notes (Signed)
   Weekly Management Note:  Outpatient    ICD-9-CM ICD-10-CM   1. 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  CBCT/MVCT images/Port film x-rays were reviewed.  The chart was checked No new complaints  Sister present with SNF staff member today.    Physical Findings:  Wt Readings from Last 3 Encounters:  03/12/15 154 lb 11.2 oz (70.171 kg)  03/05/15 152 lb 14.4 oz (69.355 kg)  02/26/15 153 lb 4.8 oz (69.536 kg)    height is 5\' 9"  (1.753 m) and weight is 154 lb 11.2 oz (70.171 kg). His temperature is 98.2 F (36.8 C). His blood pressure is 99/65 and his pulse is 67.  Patchy mucositis, becoming confluent over palate.  No thrush. Neck- Skin  Dry, erythematous  CBC    Component Value Date/Time   WBC 12.0* 01/13/2015 1550   WBC 12.5* 01/05/2015 1031   RBC 3.91* 01/13/2015 1550   RBC 4.16* 01/05/2015 1031   HGB 11.7* 01/13/2015 1550   HGB 12.6* 01/05/2015 1031   HCT 36.0* 01/13/2015 1550   HCT 37.8* 01/05/2015 1031   PLT 225 01/13/2015 1550   PLT 228 01/05/2015 1031   MCV 92.1 01/13/2015 1550   MCV 90.8 01/05/2015 1031   MCH 29.9 01/13/2015 1550   MCH 30.2 01/05/2015 1031   MCHC 32.5 01/13/2015 1550   MCHC 33.2 01/05/2015 1031   RDW 13.8 01/13/2015 1550   RDW 14.6 01/05/2015 1031   LYMPHSABS 1.7 01/13/2015 1550   LYMPHSABS 2.3 01/05/2015 1031   MONOABS 0.8 01/13/2015 1550   MONOABS 1.0* 01/05/2015 1031   EOSABS 0.1 01/13/2015 1550   EOSABS 0.2 01/05/2015 1031   BASOSABS 0.0 01/13/2015 1550   BASOSABS 0.1 01/05/2015 1031     CMP     Component Value Date/Time   NA 132* 01/13/2015 1550   NA 136 01/05/2015 1031   K 4.8 01/13/2015 1550   K 4.2 01/05/2015 1031   CL 96* 01/13/2015 1550   CO2 27 01/13/2015 1550   CO2 26 01/05/2015 1031   GLUCOSE 114* 01/13/2015 1550   GLUCOSE 116 01/05/2015 1031   BUN 15  01/13/2015 1550   BUN 22.1 01/05/2015 1031   CREATININE 0.82 01/13/2015 1550   CREATININE 1.2 01/05/2015 1031   CALCIUM 9.6 01/13/2015 1550   CALCIUM 10.0 01/05/2015 1031   PROT 7.2 01/05/2015 1031   ALBUMIN 3.8 01/05/2015 1031   AST 10 01/05/2015 1031   ALT <9 01/05/2015 1031   ALKPHOS 50 01/05/2015 1031   BILITOT 0.96 01/05/2015 1031   GFRNONAA >60 01/13/2015 1550   GFRAA >60 01/13/2015 1550     Impression:  The patient is tolerating radiotherapy.   Plan:  Continue radiotherapy as planned.  -----------------------------------  Eppie Gibson, MD

## 2015-03-13 ENCOUNTER — Ambulatory Visit
Admission: RE | Admit: 2015-03-13 | Discharge: 2015-03-13 | Disposition: A | Discharge status: Home / Self Care | Payer: No Typology Code available for payment source | Source: Ambulatory Visit | Attending: Radiation Oncology | Admitting: Radiation Oncology

## 2015-03-13 DIAGNOSIS — Z510 Encounter for antineoplastic radiation therapy: Secondary | ICD-10-CM | POA: Diagnosis not present

## 2015-03-14 ENCOUNTER — Ambulatory Visit
Admission: RE | Admit: 2015-03-14 | Discharge: 2015-03-14 | Disposition: A | Discharge status: Home / Self Care | Payer: No Typology Code available for payment source | Source: Ambulatory Visit | Attending: Radiation Oncology | Admitting: Radiation Oncology

## 2015-03-14 DIAGNOSIS — Z510 Encounter for antineoplastic radiation therapy: Secondary | ICD-10-CM | POA: Diagnosis not present

## 2015-03-15 ENCOUNTER — Ambulatory Visit
Admission: RE | Admit: 2015-03-15 | Discharge: 2015-03-15 | Disposition: A | Discharge status: Home / Self Care | Payer: No Typology Code available for payment source | Source: Ambulatory Visit | Attending: Radiation Oncology | Admitting: Radiation Oncology

## 2015-03-15 ENCOUNTER — Ambulatory Visit: Payer: No Typology Code available for payment source

## 2015-03-15 ENCOUNTER — Encounter: Payer: Non-veteran care | Admitting: Nutrition

## 2015-03-15 DIAGNOSIS — Z510 Encounter for antineoplastic radiation therapy: Secondary | ICD-10-CM | POA: Diagnosis not present

## 2015-03-16 ENCOUNTER — Ambulatory Visit: Admission: RE | Admit: 2015-03-16 | Payer: No Typology Code available for payment source | Source: Ambulatory Visit

## 2015-03-16 ENCOUNTER — Ambulatory Visit: Payer: Non-veteran care | Admitting: Radiation Oncology

## 2015-03-16 ENCOUNTER — Ambulatory Visit
Admission: RE | Admit: 2015-03-16 | Discharge: 2015-03-16 | Disposition: A | Discharge status: Home / Self Care | Payer: No Typology Code available for payment source | Source: Ambulatory Visit | Attending: Radiation Oncology | Admitting: Radiation Oncology

## 2015-03-20 ENCOUNTER — Ambulatory Visit
Admission: RE | Admit: 2015-03-20 | Discharge: 2015-03-20 | Disposition: A | Discharge status: Home / Self Care | Payer: No Typology Code available for payment source | Source: Ambulatory Visit | Attending: Radiation Oncology | Admitting: Radiation Oncology

## 2015-03-20 ENCOUNTER — Ambulatory Visit: Payer: No Typology Code available for payment source

## 2015-03-20 DIAGNOSIS — Z510 Encounter for antineoplastic radiation therapy: Secondary | ICD-10-CM | POA: Diagnosis not present

## 2015-03-21 ENCOUNTER — Ambulatory Visit: Payer: No Typology Code available for payment source

## 2015-03-21 ENCOUNTER — Ambulatory Visit
Admission: RE | Admit: 2015-03-21 | Discharge: 2015-03-21 | Disposition: A | Discharge status: Home / Self Care | Payer: Non-veteran care | Source: Ambulatory Visit | Attending: Radiation Oncology | Admitting: Radiation Oncology

## 2015-03-21 ENCOUNTER — Ambulatory Visit
Admission: RE | Admit: 2015-03-21 | Discharge: 2015-03-21 | Disposition: A | Discharge status: Home / Self Care | Payer: No Typology Code available for payment source | Source: Ambulatory Visit | Attending: Radiation Oncology | Admitting: Radiation Oncology

## 2015-03-21 ENCOUNTER — Encounter: Payer: Self-pay | Admitting: Radiation Oncology

## 2015-03-21 VITALS — BP 117/73 | HR 64 | Temp 97.6°F | Resp 16 | Wt 156.0 lb

## 2015-03-21 DIAGNOSIS — C321 Malignant neoplasm of supraglottis: Secondary | ICD-10-CM

## 2015-03-21 DIAGNOSIS — Z510 Encounter for antineoplastic radiation therapy: Secondary | ICD-10-CM | POA: Diagnosis not present

## 2015-03-21 NOTE — Progress Notes (Signed)
   Weekly Management Note:  Outpatient    ICD-9-CM ICD-10-CM   1. 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  CBCT/MVCT images/Port film x-rays were reviewed.  The chart was checked He is sleepy and with a bit of upper respiratory congestion Sister present with SNF staff member today.   Gained 2 lbs Some treatments were missed and added to end of this month due to patient having issues with cooperating and staying still on LINAC table.  Physical Findings:  Wt Readings from Last 3 Encounters:  03/21/15 156 lb (70.761 kg)  03/12/15 154 lb 11.2 oz (70.171 kg)  03/05/15 152 lb 14.4 oz (69.355 kg)    weight is 156 lb (70.761 kg). His oral temperature is 97.6 F (36.4 C). His blood pressure is 117/73 and his pulse is 64. His respiration is 16 and oxygen saturation is 100%.    mucositis,  With some bleeding, confluent over palate.  No thrush.  Thick saliva. Neck- Skin  Dry, erythematous. No rhonchi, wheezes or rales (chest) but little respiratory effort.  CBC    Component Value Date/Time   WBC 12.0* 01/13/2015 1550   WBC 12.5* 01/05/2015 1031   RBC 3.91* 01/13/2015 1550   RBC 4.16* 01/05/2015 1031   HGB 11.7* 01/13/2015 1550   HGB 12.6* 01/05/2015 1031   HCT 36.0* 01/13/2015 1550   HCT 37.8* 01/05/2015 1031   PLT 225 01/13/2015 1550   PLT 228 01/05/2015 1031   MCV 92.1 01/13/2015 1550   MCV 90.8 01/05/2015 1031   MCH 29.9 01/13/2015 1550   MCH 30.2 01/05/2015 1031   MCHC 32.5 01/13/2015 1550   MCHC 33.2 01/05/2015 1031   RDW 13.8 01/13/2015 1550   RDW 14.6 01/05/2015 1031   LYMPHSABS 1.7 01/13/2015 1550   LYMPHSABS 2.3 01/05/2015 1031   MONOABS 0.8 01/13/2015 1550   MONOABS 1.0* 01/05/2015 1031   EOSABS 0.1 01/13/2015 1550   EOSABS 0.2 01/05/2015 1031   BASOSABS 0.0 01/13/2015 1550   BASOSABS 0.1 01/05/2015 1031      CMP     Component Value Date/Time   NA 132* 01/13/2015 1550   NA 136 01/05/2015 1031   K 4.8 01/13/2015 1550   K 4.2 01/05/2015 1031   CL 96* 01/13/2015 1550   CO2 27 01/13/2015 1550   CO2 26 01/05/2015 1031   GLUCOSE 114* 01/13/2015 1550   GLUCOSE 116 01/05/2015 1031   BUN 15 01/13/2015 1550   BUN 22.1 01/05/2015 1031   CREATININE 0.82 01/13/2015 1550   CREATININE 1.2 01/05/2015 1031   CALCIUM 9.6 01/13/2015 1550   CALCIUM 10.0 01/05/2015 1031   PROT 7.2 01/05/2015 1031   ALBUMIN 3.8 01/05/2015 1031   AST 10 01/05/2015 1031   ALT <9 01/05/2015 1031   ALKPHOS 50 01/05/2015 1031   BILITOT 0.96 01/05/2015 1031   GFRNONAA >60 01/13/2015 1550   GFRAA >60 01/13/2015 1550     Impression:  The patient is tolerating radiotherapy.   Plan:  Continue radiotherapy as planned.  F/u in 1 month, encouraged to call if issues arise earlier. -----------------------------------  Eppie Gibson, MD

## 2015-03-21 NOTE — Progress Notes (Signed)
Weight and vitals stable. Denies taking or drinking anything by mouth. Receiving nutrition and water via PEG tube. Patient believes four cans of supplement are instill daily via his PEG tube but, he is uncertain. PEG tube dressing dry and intact. Hyperpigmentation with dry desquamation of anterior neck noted. Thick sputum noted. Congestion auscultated. Reports dry mouth continues. Denies pain associated with swallowing. Reports he is very sleepy.   BP 117/73 mmHg  Pulse 64  Temp(Src) 97.6 F (36.4 C) (Oral)  Resp 16  Wt 156 lb (70.761 kg)  SpO2 100% Wt Readings from Last 3 Encounters:  03/21/15 156 lb (70.761 kg)  03/12/15 154 lb 11.2 oz (70.171 kg)  03/05/15 152 lb 14.4 oz (69.355 kg)

## 2015-03-22 ENCOUNTER — Ambulatory Visit: Payer: No Typology Code available for payment source

## 2015-03-22 ENCOUNTER — Ambulatory Visit
Admission: RE | Admit: 2015-03-22 | Discharge: 2015-03-22 | Disposition: A | Discharge status: Home / Self Care | Payer: No Typology Code available for payment source | Source: Ambulatory Visit | Attending: Radiation Oncology | Admitting: Radiation Oncology

## 2015-03-22 DIAGNOSIS — Z510 Encounter for antineoplastic radiation therapy: Secondary | ICD-10-CM | POA: Diagnosis not present

## 2015-03-23 ENCOUNTER — Encounter: Payer: Self-pay | Admitting: Radiation Oncology

## 2015-03-23 ENCOUNTER — Ambulatory Visit
Admission: RE | Admit: 2015-03-23 | Discharge: 2015-03-23 | Disposition: A | Discharge status: Home / Self Care | Payer: No Typology Code available for payment source | Source: Ambulatory Visit | Attending: Radiation Oncology | Admitting: Radiation Oncology

## 2015-03-23 ENCOUNTER — Encounter: Payer: Self-pay | Admitting: *Deleted

## 2015-03-23 DIAGNOSIS — Z510 Encounter for antineoplastic radiation therapy: Secondary | ICD-10-CM | POA: Diagnosis not present

## 2015-03-23 NOTE — Progress Notes (Signed)
  Oncology Nurse Navigator Documentation   Navigator Encounter Type: Treatment (03/23/15 0950) Patient Visit Type: XTKWIO (03/23/15 0950) Treatment Phase: Final Radiation Tx (03/23/15 0950)       Met with pt during final RT to offer support and to celebrate end of radiation treatment.  He was accompanied by his sister, her husband, and [facility] caregiver. I provided post-RT guidance:  Importance of protecting treatment area from sun.  Continuation of Biafine application 2-3 times daily. I explained that my role as navigator will continue for several more months and that I will be calling and/or joining him during follow-up visits.   I encouraged family/caregiver to call me with needs/concerns.   They verbalized understanding of information provided.  Gayleen Orem, RN, BSN, [place] at [place] [phone]                Time Spent with Patient: 45 (03/23/15 0950)

## 2015-04-04 NOTE — Progress Notes (Deleted)
°  Radiation Oncology         [phone] ________________________________  Name: Nathaniel Floyd MRN: [id number]  Date: 03/23/2015  DOB: 02-11-1951  End of Treatment Note  Outpatient  Diagnosis:    ICD-9-CM ICD-10-CM   1. 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     Plan: The patient has completed radiation treatment. The patient will return to radiation oncology clinic for routine followup in one month. I advised the patient's family to call or return sooner if they have any questions or concerns related to their recovery or treatment.  -----------------------------------  Eppie Gibson, MD

## 2015-04-17 NOTE — Progress Notes (Signed)
  Radiation Oncology         [phone] ________________________________  Name: Ascher Bertrand MRN: [id number]  Date: 03/23/2015  DOB: October 25, 1950  End of Treatment Note  Outpatient  Diagnosis:    ICD-9-CM ICD-10-CM   1. 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     Plan: The patient has completed radiation treatment. The patient will return to radiation oncology clinic for routine followup in one month. I advised the patient's family to call or return sooner if they have any questions or concerns related to their recovery or treatment.  -----------------------------------  Eppie Gibson, MD

## 2015-04-27 ENCOUNTER — Ambulatory Visit
Admission: RE | Admit: 2015-04-27 | Discharge: 2015-04-27 | Disposition: A | Discharge status: Home / Self Care | Payer: Non-veteran care | Source: Ambulatory Visit | Attending: Radiation Oncology | Admitting: Radiation Oncology

## 2015-04-27 ENCOUNTER — Encounter: Payer: Self-pay | Admitting: Radiation Oncology

## 2015-04-27 VITALS — BP 95/68 | HR 66 | Temp 98.1°F | Resp 16 | Ht 69.0 in | Wt 159.4 lb

## 2015-04-27 DIAGNOSIS — C321 Malignant neoplasm of supraglottis: Secondary | ICD-10-CM

## 2015-04-27 MED ORDER — OXYCODONE HCL 5 MG/5ML PO SOLN: ORAL | Status: AC

## 2015-04-27 NOTE — Progress Notes (Signed)
Radiation Oncology         [phone] ________________________________  Name: Nathaniel Floyd MRN: [id number]  Date: 04/27/2015  DOB: 01-Mar-1951  Follow-Up Visit Note  Outpatient  CC: No PCP Per Patient  Nathaniel Heinrich, MD  Diagnosis and Prior Radiotherapy: No diagnosis found.   Diagnosis:    ICD-9-CM ICD-10-CM   1. 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  Narrative:  The patient returns today for routine follow-up.  The patient reports pain in his mid throat that he rate as a 6/10. He is using a feeding tube. He receives feeding at 8 pm and 12 pm each day. He does not have changes in his weight. He sees SLP at [facility] for  swallowing issues. He is swallowing a little soft food. He last saw ENT in the past week, at [place]:  has No Known Allergies.  Meds: Current Outpatient Prescriptions  Medication Sig Dispense Refill  . aspirin 81 MG chewable tablet Chew 81 mg by mouth daily.    . benztropine (COGENTIN) 1 MG tablet Take 1 mg by mouth daily. Take one-half tablet by mouth daily for EPS    . cetirizine (ZYRTEC) 10 MG tablet Take 10 mg by mouth daily. For allergies    . cholecalciferol (VITAMIN D) 1000 UNITS tablet Take 1,000 Units by mouth daily. Take 2,000 units tablet. Take one tablet by mouth daily after finishing vit D 50000 unit capsules    . citalopram (CELEXA) 40 MG tablet Take 40 mg by mouth daily. Take one-half tablet by mouth daily for mood.    . docusate sodium (COLACE) 100 MG capsule Take 100 mg by mouth daily as needed for mild constipation. Take one capsule by mouth at bedtime for  constipation.    Marland Kitchen donepezil (ARICEPT) 10 MG tablet Take 10 mg by mouth every morning. Take one tablet by mouth in the morning for memory    . fluconazole (DIFLUCAN) 10 MG/ML suspension Administer 200mg  ( 57mL) via PEG tube today, then 100mg  (39mL) via PEG tube daily for 20 more days for oral yeast infection. HOLD SIMVASTATIN, and only take 20mg  of CITALOPRAM daily WHILE ON THIS MED. 2200 mL 0  . haloperidol (HALDOL) 10 MG tablet Take 10 mg by mouth at bedtime. Take one tablet by mouth at bedtime for psychosis    . haloperidol (HALDOL) 2 MG/ML solution Take 2 mg by mouth every 6 (six) hours as needed for agitation.    Marland Kitchen HYDROcodone-acetaminophen (HYCET) 7.5-325 mg/15 ml solution Take 10 mLs by mouth every 4 (four) hours as needed for moderate pain (take two to four teaspoonsfuls (10-49ml) by mouth every four hours as needed for mild to moderate pain (10 ml for mild pain and 20 ml for moderate pain.).    Marland Kitchen ipratropium-albuterol (DUONEB) 0.5-2.5 (3) MG/3ML SOLN Take 3 mLs by nebulization every 6 (six) hours as needed (albuterol 100/ Ipratro 20 mcg 120D po inhl inhale1 puff by mouth four times a day).    Marland Kitchen ketoconazole (NIZORAL) 2 % cream Apply 1 application topically 2 (two) times daily. Apply small amount to affected  area in the morning and at bedtime apply to both feet twice daily for athletes foot fungus infection.    Marland Kitchen levothyroxine (SYNTHROID, LEVOTHROID) 137 MCG tablet Take 137 mcg by mouth daily before breakfast. Take one tablet by mouth daily    . lidocaine (XYLOCAINE) 2 % solution Caregivers: Mix 1part 2% viscous lidocaine, 1part H20. Tell patient to swish and spit 82mL of this mixture, up to QID, for sore mouth. 100 mL 5  . lisinopril (PRINIVIL,ZESTRIL) 10 MG tablet Take 10 mg by mouth daily. Take one-half tablet by mouth daily for heart    . metFORMIN (GLUCOPHAGE) 500 MG tablet Take 500 mg by mouth daily with breakfast. Take one tablet daily by mouth    . QUEtiapine (SEROQUEL) 25 MG tablet Take  25 mg by mouth daily. Take one tablet by mouth daily at 4:00 pm every evening for agitation.    . ranitidine (ZANTAC) 150 MG tablet Take 150 mg by mouth daily.    . simvastatin (ZOCOR) 40 MG tablet Take 40 mg by mouth daily. Take one-half tablet by mouth at bedtime for cholesterol.    . tamsulosin (FLOMAX) 0.4 MG CAPS capsule Take 0.4 mg by mouth at bedtime.    Marland Kitchen acetaminophen (TYLENOL) 160 MG/5ML liquid Take 325 mg by mouth every 4 (four) hours as needed for fever (take 2 teaspoonful by mouth four times a day as needed). Reported on 04/27/2015    . fluticasone (FLOVENT DISKUS) 50 MCG/BLIST diskus inhaler Inhale 1 puff into the lungs 2 (two) times daily. Reported on 04/27/2015    . nicotine (NICODERM CQ - DOSED IN MG/24 HOURS) 14 mg/24hr patch Place 14 mg onto the skin daily. Reported on 04/27/2015    . polyvinyl alcohol-povidone (HYPOTEARS) 1.4-0.6 % ophthalmic solution Place 1 drop into both eyes as needed. Reported on 04/27/2015     No current facility-administered medications for this encounter.    Physical Findings: The patient is in no acute distress. Patient is alert and oriented.  height is 5\' 9"  (1.753 m) and weight is 159 lb 6.4 oz (72.303 kg). His oral temperature is 98.1 F (36.7 C). His blood pressure is 95/68 and his pulse is 66. His respiration is 16 and oxygen saturation is 100%. Marland Kitchen HEENT: Head is normocephalic. Extraocular movements are intact. No thrush/ Oropharynx is clear. Palate tumor is gone Neck: Neck is supple, no palpable cervical or supraclavicular lymphadenopathy. Skin : dry over neck.   Lab Findings: Lab Results  Component Value Date   WBC 12.0* 01/13/2015   HGB 11.7* 01/13/2015   HCT 36.0* 01/13/2015   MCV 92.1 01/13/2015   PLT 225 01/13/2015    Radiographic Findings: No results found.   Impression/Plan:  I prescribed him more Oxycodone. I also recommended his sister buy him lotion with vitamin E for his neck. I will order a PET scan in a two months at [facility]. I will follow up with him after the PET scan.  Continue to follow with Dr Edison Nasuti at [facility]. Continue SLP and advance diet.   _____________________________________   Eppie Gibson, MD    This document serves as a record of services personally performed by Eppie Gibson, MD. It was created on her behalf by Lendon Collar, a trained medical scribe. The creation of this record is based on the scribe's personal observations and the provider's statements to them. This document has been checked and approved by the attending provider.

## 2015-04-30 ENCOUNTER — Telehealth: Payer: Self-pay | Admitting: *Deleted

## 2015-04-30 NOTE — Addendum Note (Signed)
Encounter addended by: Eppie Gibson, MD on: 04/30/2015  8:53 AM<BR>     Documentation filed: Arn Medal VN

## 2015-04-30 NOTE — Telephone Encounter (Signed)
CALLED PATIENT TO INFORM OF PET ON 07-05-15 AND HIS FU WITH DR. Isidore Moos ON 07-06-15, LVM FOR A RETURN CALL, I ALSO CALLED THE NURSING HOME WHERE HE LIVES AND GAVE THE APPT. INFO. TO THIS PATIENT'S NURSE.

## 2015-07-05 ENCOUNTER — Telehealth: Payer: Self-pay | Admitting: *Deleted

## 2015-07-05 ENCOUNTER — Ambulatory Visit (HOSPITAL_COMMUNITY): Payer: Non-veteran care

## 2015-07-05 NOTE — Telephone Encounter (Signed)
  Oncology Nurse Navigator Documentation  Navigator Location: [facility] (07/05/15 [id number]) Navigator Encounter Type: Telephone (07/05/15 0723) Telephone: Incoming Call;Appt Confirmation/Clarification (07/05/15 [id number])           Treatment Phase: Post-Tx Follow-up (07/05/15 [id number])     Received call from Mr. Clarkin sister, Royston Cowper.  She reported ENT Dr. Edison Nasuti has identified recurrence of his cancer, is recommending palliative care.  In this context, his PET scheduled for earlier this week was cancelled.  Ms. Linus Salmons indicated she would like to keep tomorrow's appt with Dr. Isidore Moos even though Dominica Severin will not be in attendance.  She wishes to have an opportunity to discuss his current situation and prognosis.  I assured her that Dr. Isidore Moos will be available as scheduled.  Dr. Isidore Moos and RN Anderson Malta Malmfelt notified.  Gayleen Orem, RN, BSN, [place] at [facility] [phone]                             Time Spent with Patient: 15 (07/05/15 0723)

## 2015-07-05 NOTE — Telephone Encounter (Signed)
  Oncology Nurse Navigator Documentation  Navigator Location: [facility] (07/05/15 1027) Navigator Encounter Type: Telephone (07/05/15 1027) Telephone: Incoming Call;Patient Update (07/05/15 1027)           Treatment Phase: Post-Tx Follow-up (07/05/15 1027)      Received call from Truman Hayward, patient's sister, indicating that she is cancelling appt for tomorrow with Dr. Isidore Moos.  She noted she just had conversation with Dr. Weston Anna, [facility], and she is ordering a CT Neck today to further evaluate her findings during yesterday's laryngoscopy. Dr. Edison Nasuti indicated she will call Dr. Isidore Moos to discuss scan results and plan moving forward.  Dr. Isidore Moos and RN Anderson Malta notified.  Gayleen Orem, RN, BSN, [place] [facility] [place] [phone]                         Time Spent with Patient: 15 (07/05/15 1027)

## 2015-07-06 ENCOUNTER — Ambulatory Visit
Admission: RE | Admit: 2015-07-06 | Discharge: 2015-07-06 | Disposition: A | Discharge status: Home / Self Care | Payer: Non-veteran care | Source: Ambulatory Visit | Attending: Radiation Oncology | Admitting: Radiation Oncology

## 2015-08-23 DEATH — deceased

## 2017-10-23 IMAGING — CT NM PET TUM IMG INITIAL (PI) SKULL BASE T - THIGH
1 of 8 series · 1 of 25 positions shown · non-contrast
Comparison: Neck CT 01/09/2015

CLINICAL DATA: Initial treatment strategy for epiglottic cancer.

EXAM:
NUCLEAR MEDICINE PET SKULL BASE TO THIGH
TECHNIQUE: 9.0 mCi F-18 FDG was injected intravenously. Full-ring PET imaging
was performed from the skull base to thigh after the radiotracer. CT
data was obtained and used for attenuation correction and anatomic
localization.
FASTING BLOOD GLUCOSE:  Value: 95 mg/dl

[Series 4: ct hn_sk_th 5.0 b31f · axial · 5.0mm · 0.98mm/px · 1 of 227 slices shown]
[im 227/227  brain]
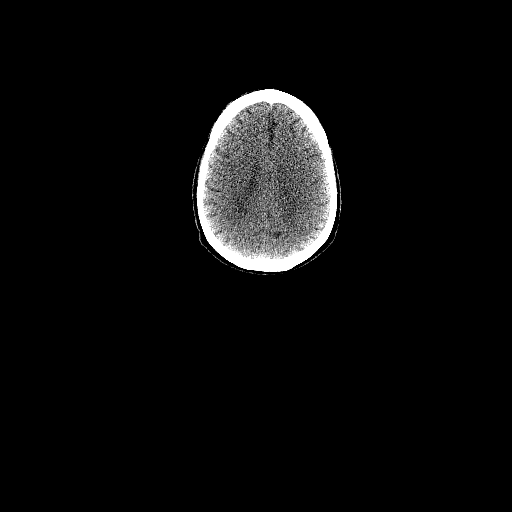

[1 of 25 positions shown; findings below may reference images not displayed]

FINDINGS: NECK

There is irregular hypermetabolic mass anterior to the epiglottis
andextending LEFT and RIGHT of midline. The mass measures
approximately 3.2 x 3.1 x 3.7 cm with intense metabolic activity
(SUV max equal 22.2).

Small level 2 lymph node on the LEFT measuring 6 mm beneath the
sternocleidomastoid muscle on image 45, series 4 has mild metabolic
activity. Small level 2 lymph node on the RIGHT also appears have
mild metabolic activity although difficult to discern from adjacent
vascular activity (7 mm image 42, series 4).

No additional hypermetabolic nodes in the neck. There is asymmetric
metabolic activity in the high RIGHT lingual tonsil region compared
to the LEFT (image 29 of the fused data set).

There is focal metabolic activity in the region of the RIGHT lobe of
thyroid gland. No discrete lymph node is evident therefore activity
is favored to be within the gland (SUV max equals 6.4 on image 55 of
the fused data set).

CHEST

No hypermetabolic mediastinal lymph nodes.

Within the superior segment of the RIGHT lower lobe 4 mm nodule
image 76, series 4. Very faint metabolic activity associated the
small nodule.

ABDOMEN/PELVIS

No abnormal hypermetabolic activity within the liver, pancreas,
adrenal glands, or spleen. No hypermetabolic lymph nodes in the
abdomen or pelvis. There is intense metabolic activity throughout
the rectosigmoid colon. This is felt to be physiologic although
difficult to evaluate due the high-density barium in the colon.
Percutaneous gastrostomy tube in the stomach.

SKELETON

No focal hypermetabolic activity to suggest skeletal metastasis.
IMPRESSION: 1. Hypermetabolic epiglottic mass consists with primary carcinoma.
2. Asymmetric metabolic activity in the high RIGHT lingual tonsil.
No clear abnormality on CT exam.
3. Concern for small bilateral level 2 lymph node metastasis.
4. Single pulmonary nodule in the superior segment RIGHT lower lobe
with faint metabolic activity. This is indeterminate with
differential including small focus of infection versus metastatic
lesion. Favor infection.
5. Hypermetabolic nodule within the RIGHT lobe of thyroid gland.
Recommend thyroid ultrasound at some point in patient's therapy.
